# Patient Record
Sex: Male | Born: 1955
Health system: Southern US, Community
[De-identification: ages and names within clinical notes are randomized; demographics above are authoritative.]

## PROBLEM LIST (undated history)

## (undated) DIAGNOSIS — R7303 Prediabetes: Secondary | ICD-10-CM

## (undated) DIAGNOSIS — G4730 Sleep apnea, unspecified: Secondary | ICD-10-CM

## (undated) DIAGNOSIS — I10 Essential (primary) hypertension: Secondary | ICD-10-CM

## (undated) HISTORY — PX: HERNIA REPAIR: SHX51

## (undated) HISTORY — DX: Sleep apnea, unspecified: G47.30

## (undated) HISTORY — DX: Prediabetes: R73.03

---

## 2002-08-21 ENCOUNTER — Emergency Department (HOSPITAL_COMMUNITY): Admission: EM | Admit: 2002-08-21 | Discharge: 2002-08-22 | Payer: Self-pay

## 2006-01-05 ENCOUNTER — Emergency Department (HOSPITAL_COMMUNITY): Admission: EM | Admit: 2006-01-05 | Discharge: 2006-01-06 | Payer: Self-pay | Admitting: Emergency Medicine

## 2006-08-31 IMAGING — CR DG CHEST 1V PORT
1 series · 1 of 1 positions shown · non-contrast
Comparison: none

CLINICAL DATA: Chest pain. 
 PORTABLE CHEST ? 1 VIEW:

[view not recorded]
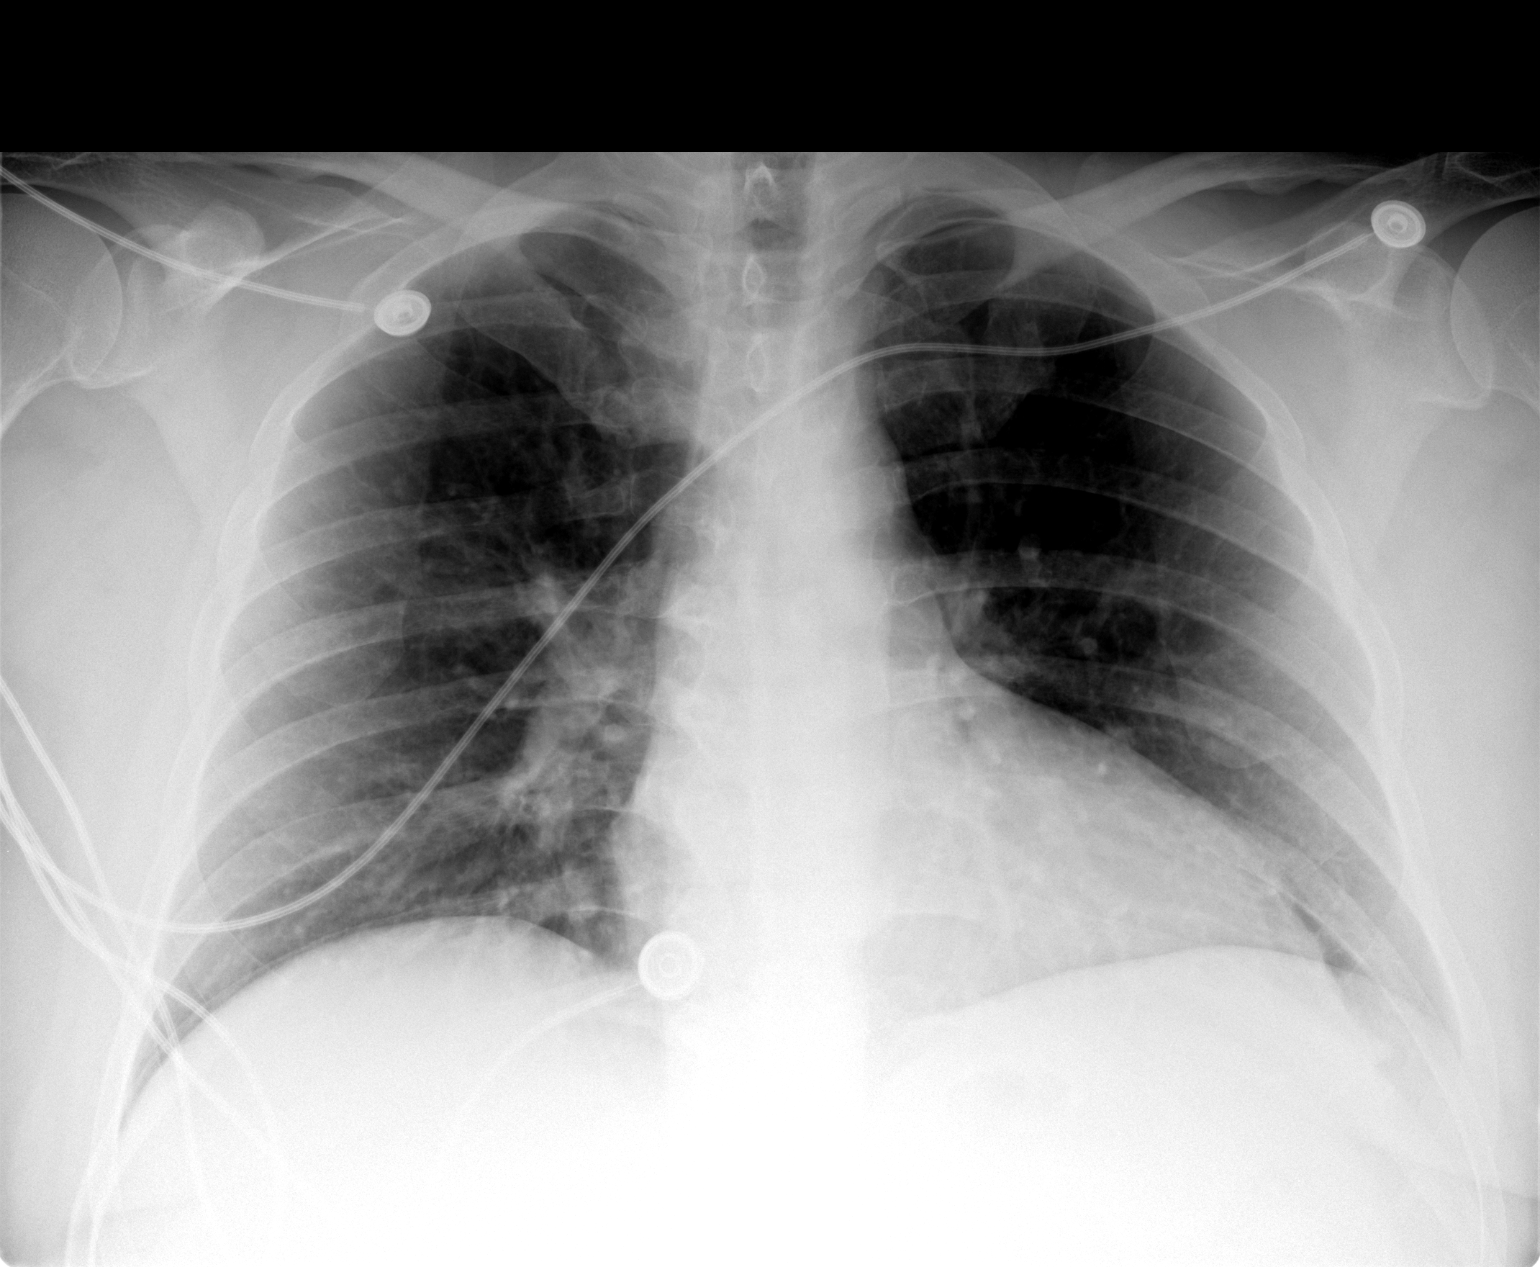

[1 of 1 positions shown; findings below may reference images not displayed]

FINDINGS: Low lung volumes are seen.  However, both lungs are clear.  Heart size and mediastinal contours are within normal limits.
IMPRESSION: Low lung volumes.  No acute findings.

## 2007-01-17 ENCOUNTER — Emergency Department (HOSPITAL_COMMUNITY): Admission: EM | Admit: 2007-01-17 | Discharge: 2007-01-18 | Payer: Self-pay | Admitting: Emergency Medicine

## 2007-01-18 IMAGING — CR DG CHEST 2V
2 series · 2 of 2 positions shown · non-contrast
Comparison: [DATE].

CLINICAL DATA: Productive cough.  Dyspnea.  
 CHEST - 2 VIEW:

[w chest pa]
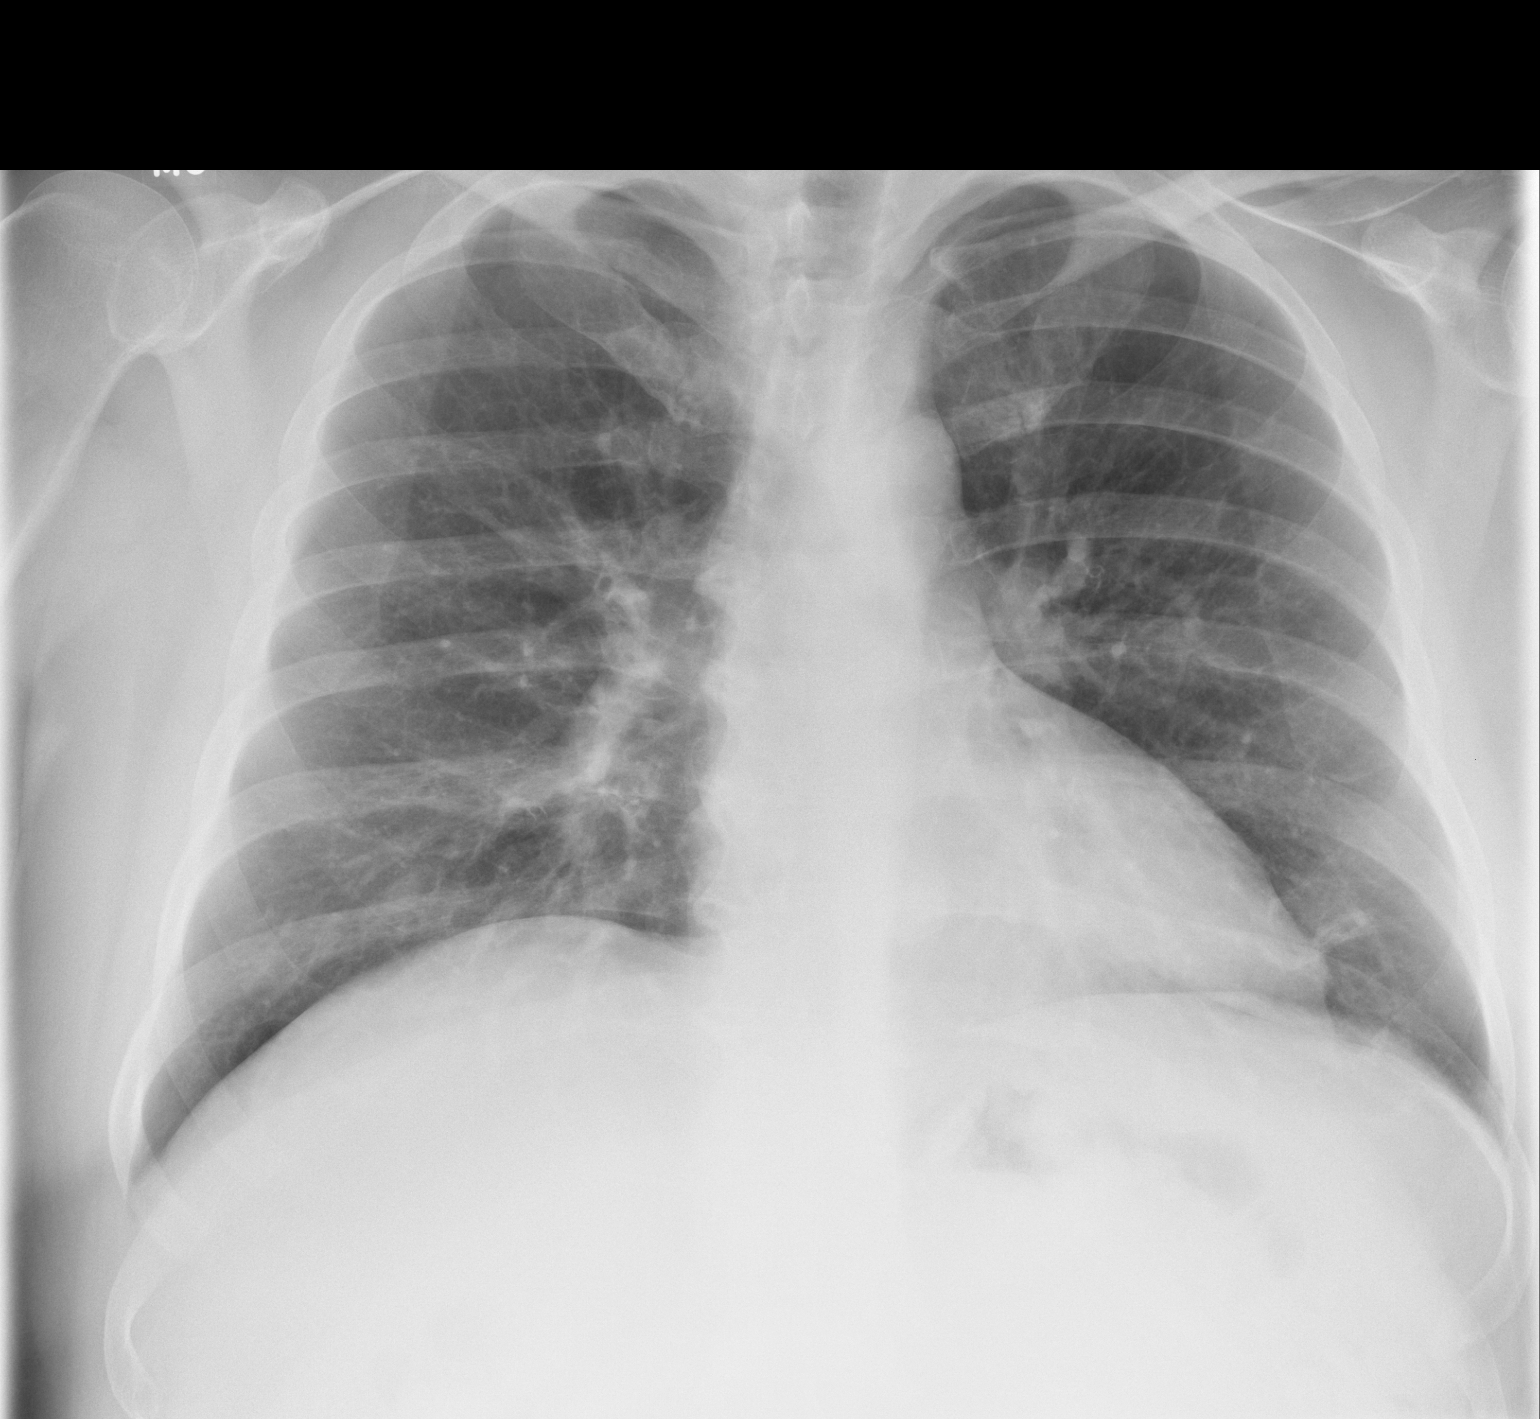

[w chest lat]
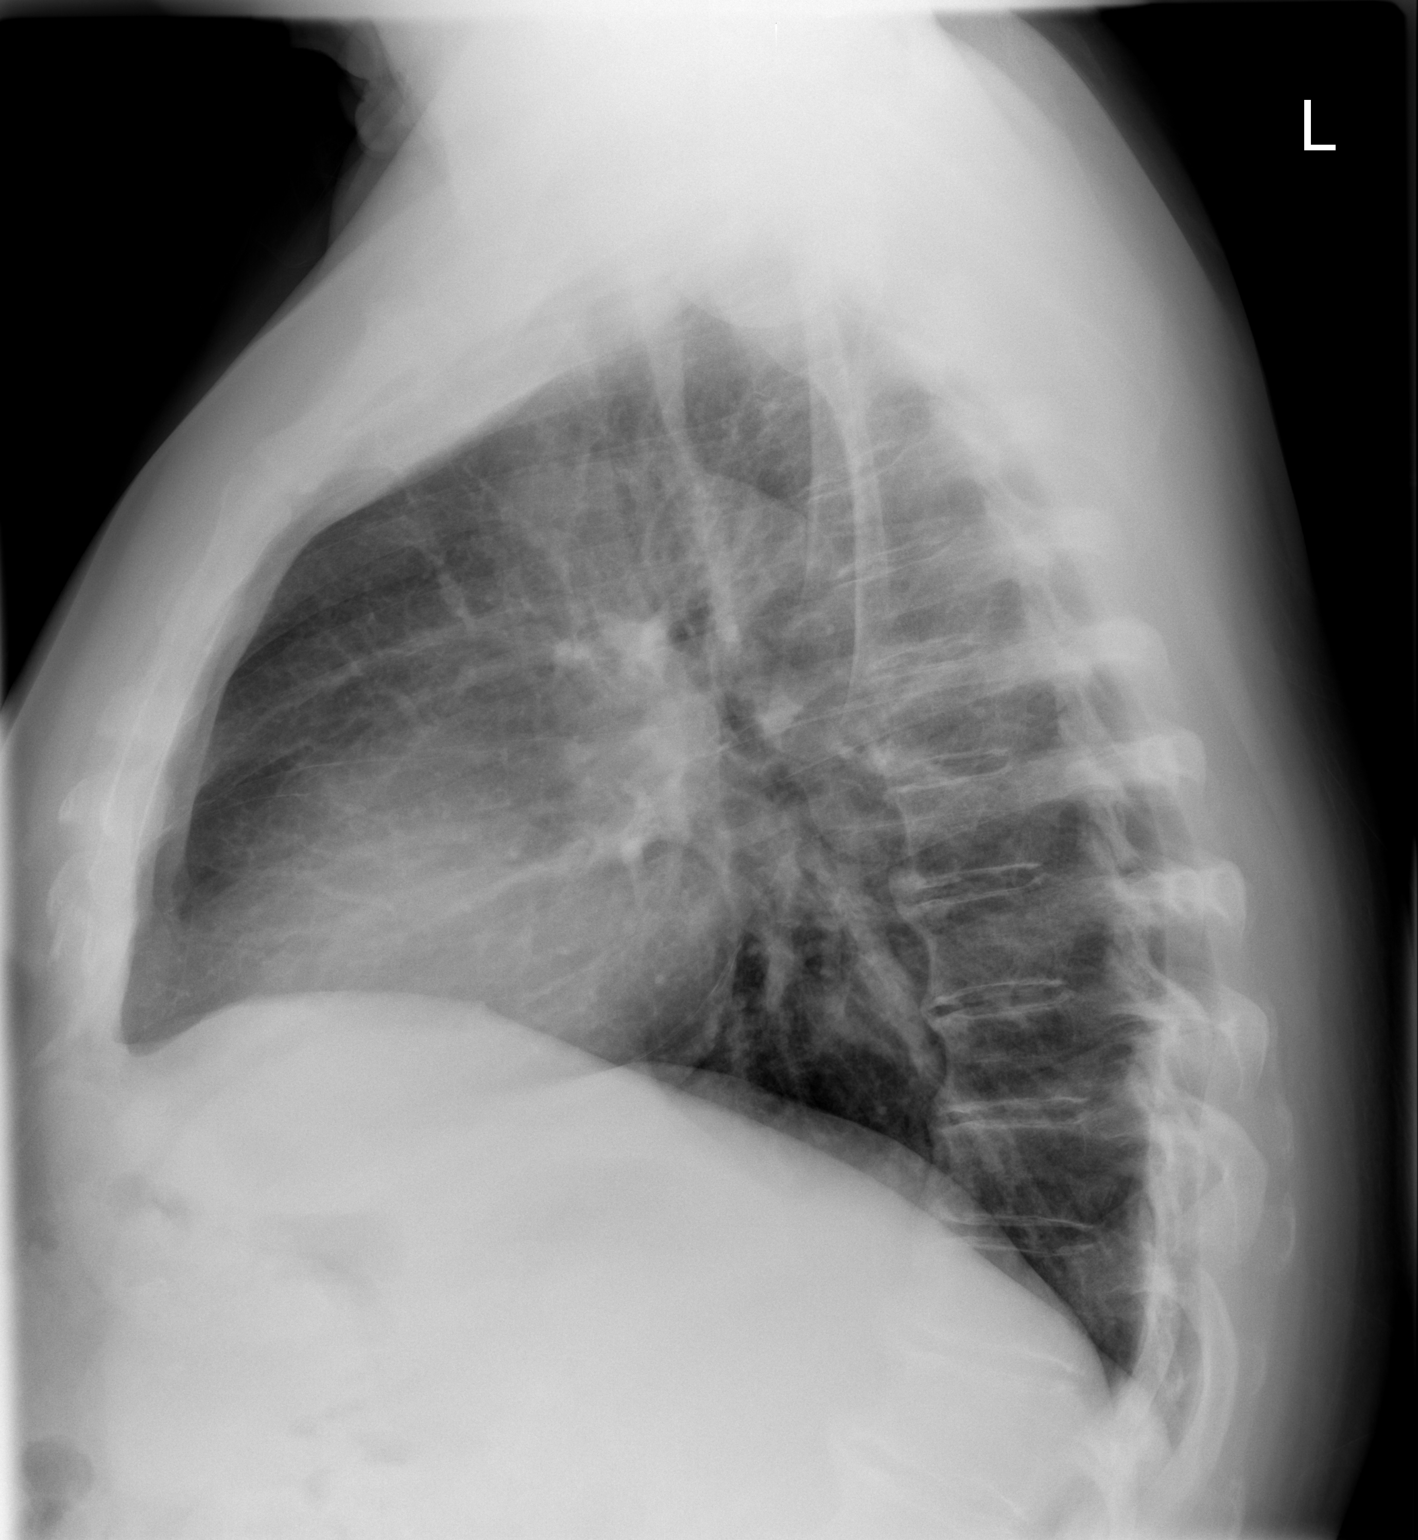

[2 of 2 positions shown; findings below may reference images not displayed]

FINDINGS: The heart size and mediastinal contours are within normal limits.  Both lungs are clear.  The visualized skeletal structures are unremarkable.
IMPRESSION: No active cardiopulmonary disease.

## 2007-03-07 ENCOUNTER — Emergency Department (HOSPITAL_COMMUNITY): Admission: EM | Admit: 2007-03-07 | Discharge: 2007-03-07 | Payer: Self-pay | Admitting: Emergency Medicine

## 2007-03-07 IMAGING — CR DG CHEST 1V PORT
1 series · 1 of 1 positions shown · non-contrast
Comparison: none

CLINICAL DATA: Chest pain.
 PORTABLE CHEST- 1 VIEW:
 This is a low-volume film.  Mild vascular congestion is noted.  Upper limits normal heart size again identified without pleural effusions or pneumothorax.

[view not recorded]
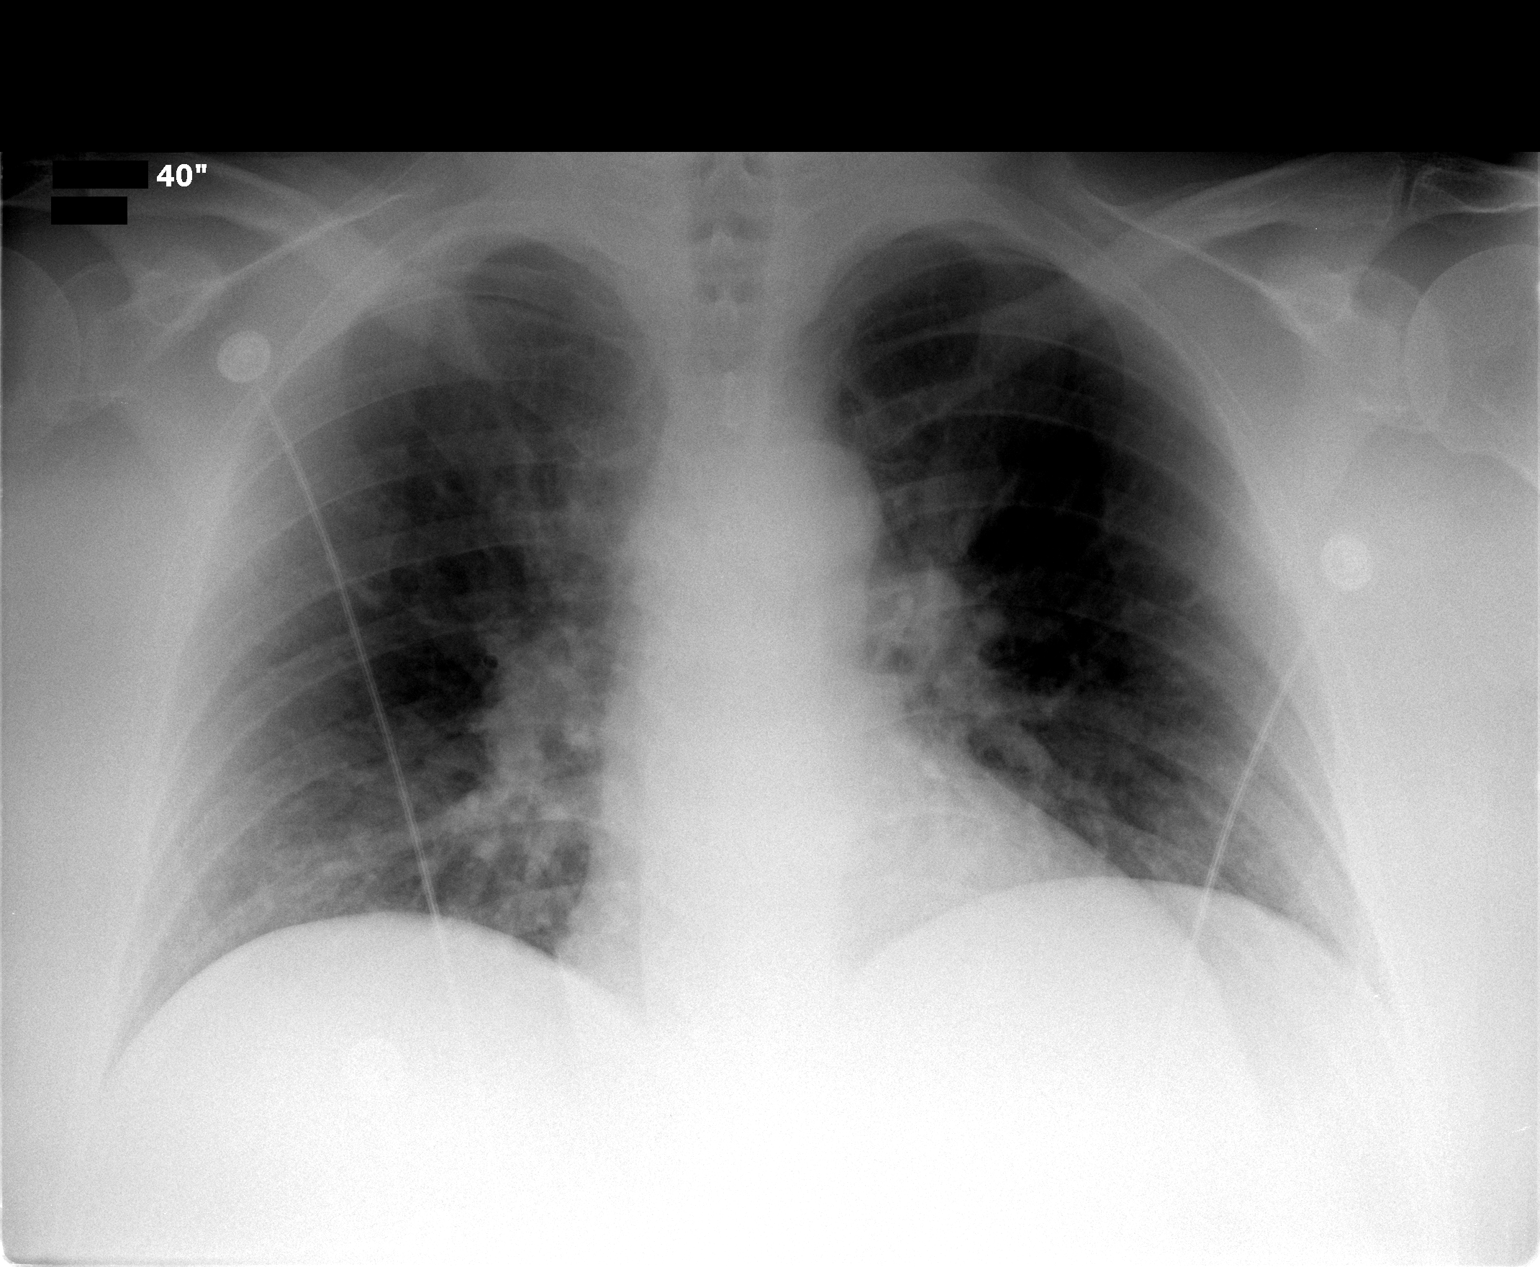

[1 of 1 positions shown; findings below may reference images not displayed]

IMPRESSION: Pulmonary vascular congestion.

## 2007-03-18 ENCOUNTER — Ambulatory Visit: Payer: Self-pay | Admitting: Cardiovascular Disease

## 2007-11-21 ENCOUNTER — Ambulatory Visit: Payer: Self-pay | Admitting: Nurse Practitioner

## 2007-11-21 DIAGNOSIS — J309 Allergic rhinitis, unspecified: Secondary | ICD-10-CM | POA: Insufficient documentation

## 2007-11-21 DIAGNOSIS — I10 Essential (primary) hypertension: Secondary | ICD-10-CM | POA: Insufficient documentation

## 2007-11-21 DIAGNOSIS — K4090 Unilateral inguinal hernia, without obstruction or gangrene, not specified as recurrent: Secondary | ICD-10-CM | POA: Insufficient documentation

## 2007-11-21 LAB — CONVERTED CEMR LAB
ALT: 24 units/L (ref 0–53)
AST: 44 units/L — ABNORMAL HIGH (ref 0–37)
Albumin: 4.4 g/dL (ref 3.5–5.2)
Alkaline Phosphatase: 58 units/L (ref 39–117)
BUN: 14 mg/dL (ref 6–23)
Basophils Absolute: 0.1 10*3/uL (ref 0.0–0.1)
Basophils Relative: 1 % (ref 0–1)
CO2: 25 meq/L (ref 19–32)
Calcium: 9 mg/dL (ref 8.4–10.5)
Chloride: 105 meq/L (ref 96–112)
Creatinine, Ser: 1.06 mg/dL (ref 0.40–1.50)
Eosinophils Absolute: 0.4 10*3/uL (ref 0.0–0.7)
Eosinophils Relative: 5 % (ref 0–5)
Glucose, Bld: 93 mg/dL (ref 70–99)
HCT: 45.5 % (ref 39.0–52.0)
Hemoglobin: 15.3 g/dL (ref 13.0–17.0)
Lymphocytes Relative: 26 % (ref 12–46)
Lymphs Abs: 1.9 10*3/uL (ref 0.7–4.0)
MCHC: 33.6 g/dL (ref 30.0–36.0)
MCV: 89.4 fL (ref 78.0–100.0)
Monocytes Absolute: 0.6 10*3/uL (ref 0.1–1.0)
Monocytes Relative: 8 % (ref 3–12)
Neutro Abs: 4.6 10*3/uL (ref 1.7–7.7)
Neutrophils Relative %: 61 % (ref 43–77)
PSA: 0.94 ng/mL (ref 0.10–4.00)
Platelets: 199 10*3/uL (ref 150–400)
Potassium: 4 meq/L (ref 3.5–5.3)
RBC: 5.09 M/uL (ref 4.22–5.81)
RDW: 12.4 % (ref 11.5–15.5)
Sodium: 142 meq/L (ref 135–145)
TSH: 2.063 microintl units/mL (ref 0.350–4.50)
Total Bilirubin: 0.5 mg/dL (ref 0.3–1.2)
Total Protein: 7.2 g/dL (ref 6.0–8.3)
WBC: 7.5 10*3/uL (ref 4.0–10.5)

## 2007-11-22 ENCOUNTER — Encounter (INDEPENDENT_AMBULATORY_CARE_PROVIDER_SITE_OTHER): Payer: Self-pay | Admitting: Nurse Practitioner

## 2007-12-05 ENCOUNTER — Ambulatory Visit: Payer: Self-pay | Admitting: Nurse Practitioner

## 2008-02-07 ENCOUNTER — Telehealth (INDEPENDENT_AMBULATORY_CARE_PROVIDER_SITE_OTHER): Payer: Self-pay | Admitting: Nurse Practitioner

## 2008-02-20 ENCOUNTER — Ambulatory Visit: Payer: Self-pay | Admitting: Nurse Practitioner

## 2008-02-23 ENCOUNTER — Ambulatory Visit: Payer: Self-pay | Admitting: *Deleted

## 2008-03-07 ENCOUNTER — Ambulatory Visit: Payer: Self-pay | Admitting: Nurse Practitioner

## 2008-03-13 IMAGING — CR DG CHEST 2V
2 series · 2 of 2 positions shown · non-contrast
Comparison: [DATE]

CLINICAL DATA: Preop radiograph

CHEST - 2 VIEW

[w chest pa *]
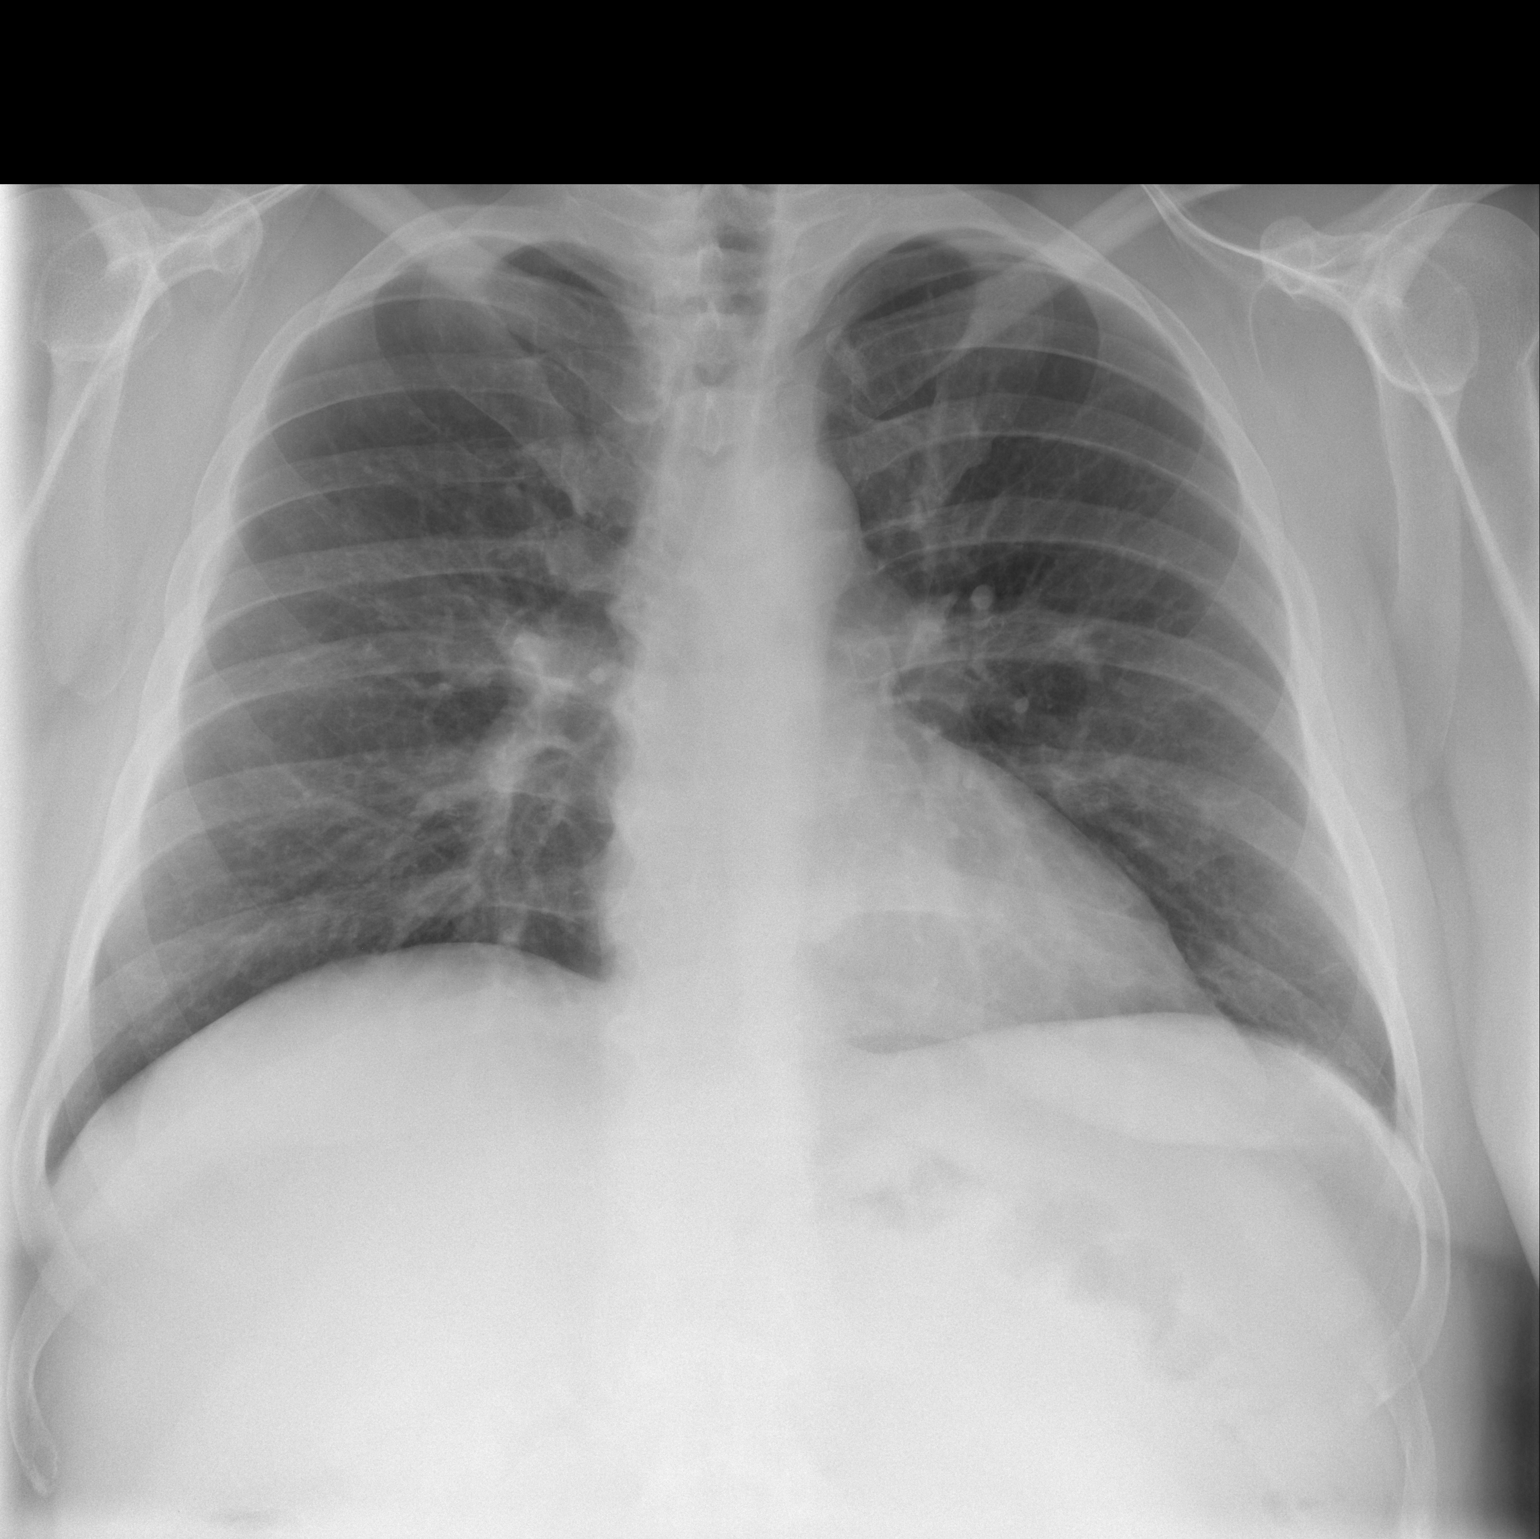

[w chest lat *]
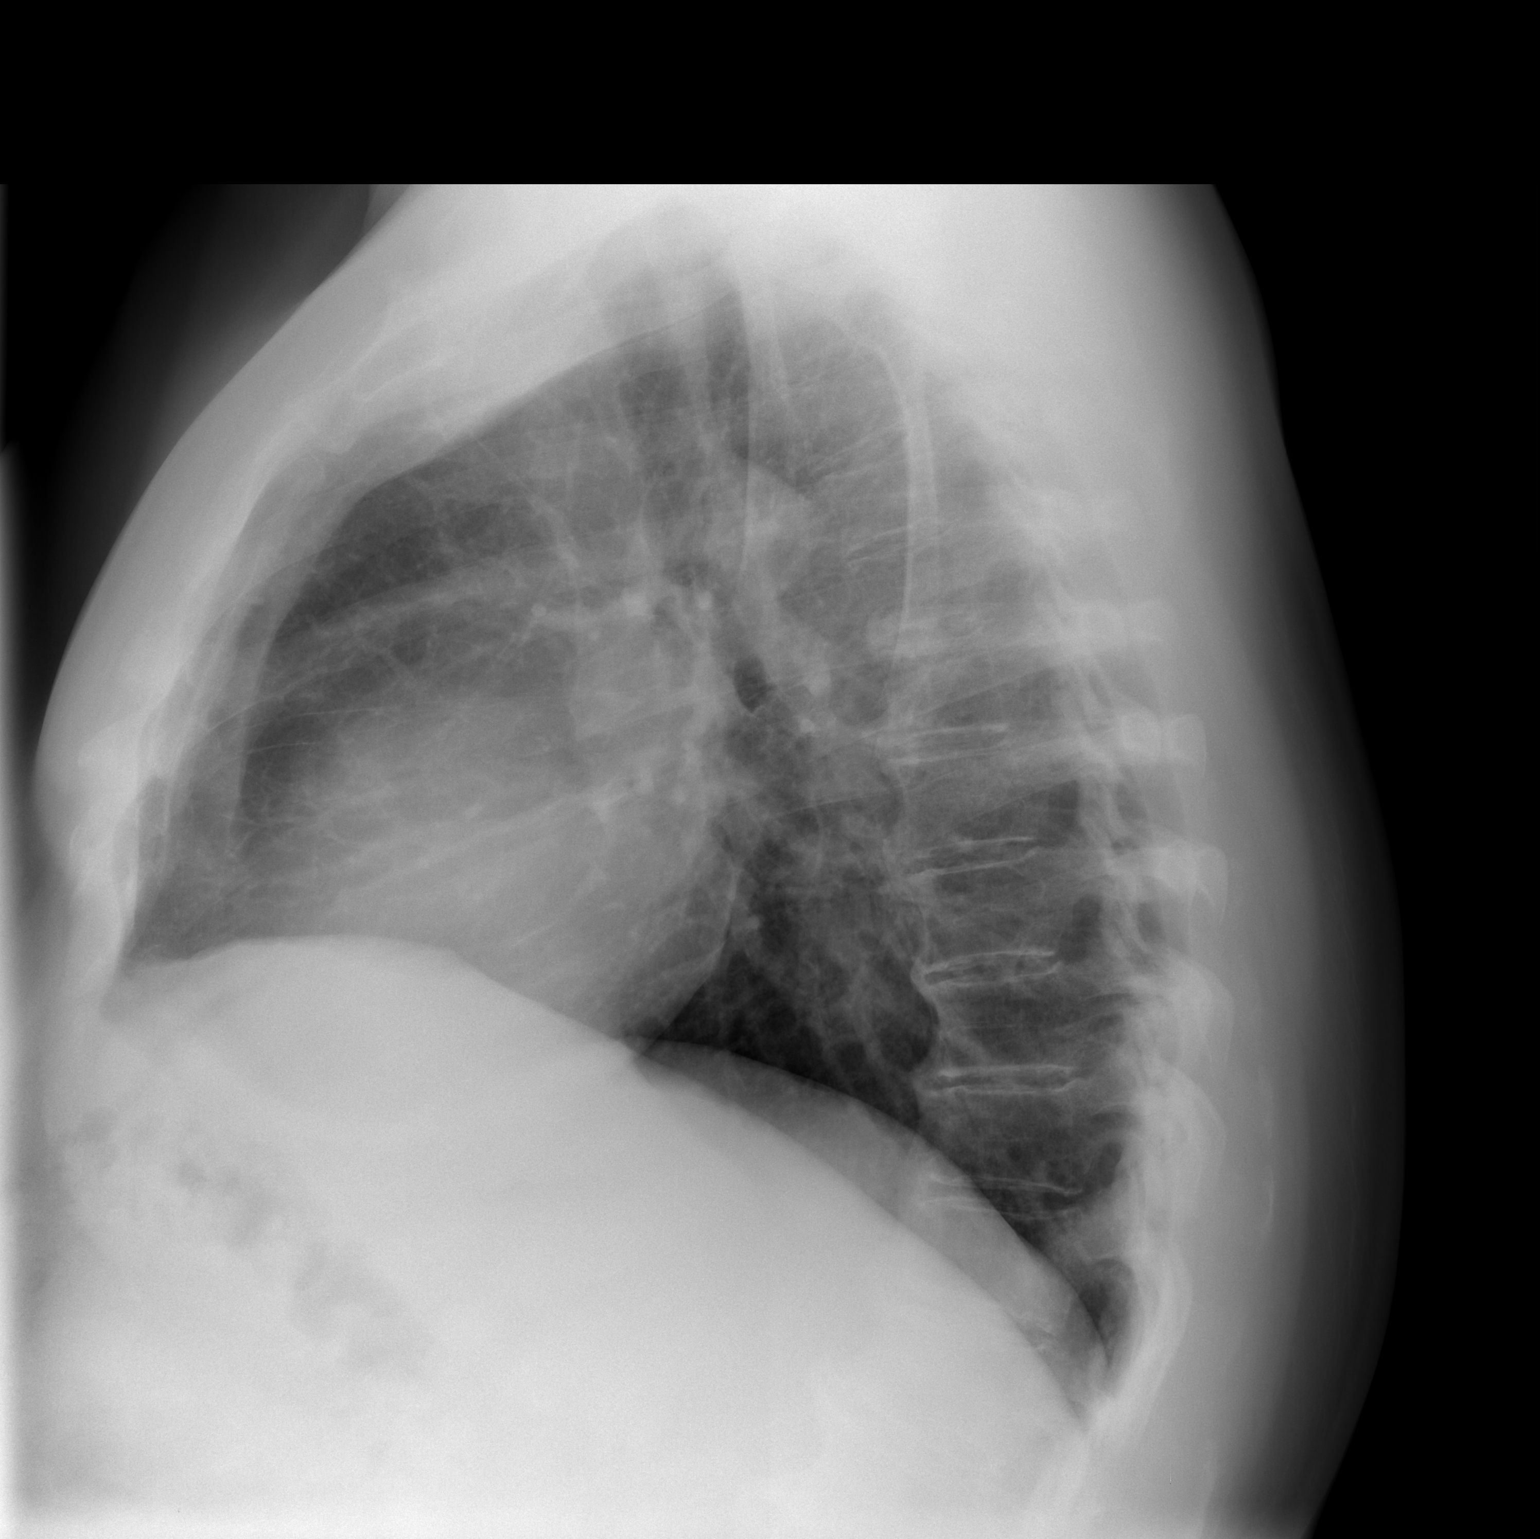

[2 of 2 positions shown; findings below may reference images not displayed]

FINDINGS: The heart size and mediastinal contours are within normal
limits.  Both lungs are clear.  The visualized skeletal structures
are unremarkable.
IMPRESSION: No active disease.

## 2008-03-15 ENCOUNTER — Encounter (INDEPENDENT_AMBULATORY_CARE_PROVIDER_SITE_OTHER): Payer: Self-pay | Admitting: General Surgery

## 2008-03-15 ENCOUNTER — Ambulatory Visit (HOSPITAL_COMMUNITY): Admission: RE | Admit: 2008-03-15 | Discharge: 2008-03-15 | Payer: Self-pay | Admitting: General Surgery

## 2008-05-21 ENCOUNTER — Ambulatory Visit: Payer: Self-pay | Admitting: Nurse Practitioner

## 2008-05-21 DIAGNOSIS — F3289 Other specified depressive episodes: Secondary | ICD-10-CM | POA: Insufficient documentation

## 2008-05-21 DIAGNOSIS — L919 Hypertrophic disorder of the skin, unspecified: Secondary | ICD-10-CM

## 2008-05-21 DIAGNOSIS — L909 Atrophic disorder of skin, unspecified: Secondary | ICD-10-CM | POA: Insufficient documentation

## 2008-05-21 DIAGNOSIS — G479 Sleep disorder, unspecified: Secondary | ICD-10-CM | POA: Insufficient documentation

## 2008-05-21 DIAGNOSIS — D239 Other benign neoplasm of skin, unspecified: Secondary | ICD-10-CM | POA: Insufficient documentation

## 2008-05-21 DIAGNOSIS — F329 Major depressive disorder, single episode, unspecified: Secondary | ICD-10-CM | POA: Insufficient documentation

## 2008-05-22 ENCOUNTER — Encounter (INDEPENDENT_AMBULATORY_CARE_PROVIDER_SITE_OTHER): Payer: Self-pay | Admitting: Nurse Practitioner

## 2008-05-29 ENCOUNTER — Ambulatory Visit: Payer: Self-pay | Admitting: Nurse Practitioner

## 2008-05-29 LAB — CONVERTED CEMR LAB
Basophils Absolute: 0.1 10*3/uL (ref 0.0–0.1)
Basophils Relative: 1 % (ref 0–1)
Eosinophils Absolute: 0.3 10*3/uL (ref 0.0–0.7)
Eosinophils Relative: 3 % (ref 0–5)
HCT: 46.9 % (ref 39.0–52.0)
Hemoglobin: 15.9 g/dL (ref 13.0–17.0)
Lymphocytes Relative: 22 % (ref 12–46)
Lymphs Abs: 1.9 10*3/uL (ref 0.7–4.0)
MCHC: 33.9 g/dL (ref 30.0–36.0)
MCV: 91.6 fL (ref 78.0–100.0)
Monocytes Absolute: 0.8 10*3/uL (ref 0.1–1.0)
Monocytes Relative: 10 % (ref 3–12)
Neutro Abs: 5.6 10*3/uL (ref 1.7–7.7)
Neutrophils Relative %: 65 % (ref 43–77)
Platelets: 216 10*3/uL (ref 150–400)
RBC: 5.12 M/uL (ref 4.22–5.81)
RDW: 13 % (ref 11.5–15.5)
Rapid Strep: NEGATIVE
WBC: 8.6 10*3/uL (ref 4.0–10.5)

## 2008-05-30 ENCOUNTER — Encounter (INDEPENDENT_AMBULATORY_CARE_PROVIDER_SITE_OTHER): Payer: Self-pay | Admitting: Nurse Practitioner

## 2008-07-03 ENCOUNTER — Ambulatory Visit: Payer: Self-pay | Admitting: Nurse Practitioner

## 2009-01-01 ENCOUNTER — Ambulatory Visit: Payer: Self-pay | Admitting: Nurse Practitioner

## 2009-03-03 ENCOUNTER — Emergency Department (HOSPITAL_COMMUNITY): Admission: EM | Admit: 2009-03-03 | Discharge: 2009-03-04 | Payer: Self-pay | Admitting: Emergency Medicine

## 2009-03-04 ENCOUNTER — Emergency Department (HOSPITAL_COMMUNITY): Admission: EM | Admit: 2009-03-04 | Discharge: 2009-03-04 | Payer: Self-pay | Admitting: Emergency Medicine

## 2009-03-04 ENCOUNTER — Telehealth (INDEPENDENT_AMBULATORY_CARE_PROVIDER_SITE_OTHER): Payer: Self-pay | Admitting: Nurse Practitioner

## 2009-03-05 ENCOUNTER — Ambulatory Visit: Payer: Self-pay | Admitting: Nurse Practitioner

## 2009-03-11 ENCOUNTER — Telehealth (INDEPENDENT_AMBULATORY_CARE_PROVIDER_SITE_OTHER): Payer: Self-pay | Admitting: Nurse Practitioner

## 2009-03-14 ENCOUNTER — Encounter (INDEPENDENT_AMBULATORY_CARE_PROVIDER_SITE_OTHER): Payer: Self-pay | Admitting: Nurse Practitioner

## 2009-04-17 ENCOUNTER — Encounter (INDEPENDENT_AMBULATORY_CARE_PROVIDER_SITE_OTHER): Payer: Self-pay | Admitting: Nurse Practitioner

## 2009-05-17 ENCOUNTER — Ambulatory Visit: Payer: Self-pay | Admitting: Nurse Practitioner

## 2009-07-21 ENCOUNTER — Emergency Department (HOSPITAL_COMMUNITY): Admission: EM | Admit: 2009-07-21 | Discharge: 2009-07-21 | Payer: Self-pay | Admitting: Emergency Medicine

## 2009-07-23 ENCOUNTER — Ambulatory Visit: Payer: Self-pay | Admitting: Nurse Practitioner

## 2009-08-21 ENCOUNTER — Ambulatory Visit: Payer: Self-pay | Admitting: Nurse Practitioner

## 2009-08-21 DIAGNOSIS — E669 Obesity, unspecified: Secondary | ICD-10-CM | POA: Insufficient documentation

## 2009-08-22 ENCOUNTER — Encounter (INDEPENDENT_AMBULATORY_CARE_PROVIDER_SITE_OTHER): Payer: Self-pay | Admitting: Nurse Practitioner

## 2009-08-22 LAB — CONVERTED CEMR LAB
BUN: 23 mg/dL (ref 6–23)
CO2: 25 meq/L (ref 19–32)
Calcium: 9.1 mg/dL (ref 8.4–10.5)
Chloride: 106 meq/L (ref 96–112)
Creatinine, Ser: 1.21 mg/dL (ref 0.40–1.50)
Glucose, Bld: 121 mg/dL — ABNORMAL HIGH (ref 70–99)
Potassium: 3.9 meq/L (ref 3.5–5.3)
Sodium: 143 meq/L (ref 135–145)

## 2009-08-27 ENCOUNTER — Telehealth (INDEPENDENT_AMBULATORY_CARE_PROVIDER_SITE_OTHER): Payer: Self-pay | Admitting: Nurse Practitioner

## 2009-11-05 ENCOUNTER — Ambulatory Visit: Payer: Self-pay | Admitting: Physician Assistant

## 2009-12-10 ENCOUNTER — Ambulatory Visit: Payer: Self-pay | Admitting: Nurse Practitioner

## 2010-02-11 ENCOUNTER — Ambulatory Visit: Payer: Self-pay | Admitting: Physician Assistant

## 2010-04-04 ENCOUNTER — Telehealth (INDEPENDENT_AMBULATORY_CARE_PROVIDER_SITE_OTHER): Payer: Self-pay | Admitting: Nurse Practitioner

## 2010-04-10 ENCOUNTER — Ambulatory Visit: Payer: Self-pay | Admitting: Nurse Practitioner

## 2010-06-01 LAB — CONVERTED CEMR LAB
ALT: 18 units/L (ref 0–53)
AST: 44 units/L — ABNORMAL HIGH (ref 0–37)
Albumin: 4.2 g/dL (ref 3.5–5.2)
Alkaline Phosphatase: 50 units/L (ref 39–117)
BUN: 15 mg/dL (ref 6–23)
CO2: 21 meq/L (ref 19–32)
Calcium: 6.5 mg/dL — ABNORMAL LOW (ref 8.4–10.5)
Chloride: 105 meq/L (ref 96–112)
Cholesterol: 160 mg/dL (ref 0–200)
Creatinine, Ser: 0.93 mg/dL (ref 0.40–1.50)
Glucose, Bld: 82 mg/dL (ref 70–99)
HCT: 45.2 % (ref 39.0–52.0)
HDL: 39 mg/dL — ABNORMAL LOW (ref >39)
Hemoglobin: 15.4 g/dL (ref 13.0–17.0)
LDL Cholesterol: 105 mg/dL — ABNORMAL HIGH (ref 0–99)
MCHC: 34.1 g/dL (ref 30.0–36.0)
MCV: 89.7 fL (ref 78.0–100.0)
Microalb, Ur: 0.56 mg/dL (ref 0.00–1.89)
PSA: 1.1 ng/mL (ref 0.10–4.00)
Platelets: 189 10*3/uL (ref 150–400)
Potassium: 4 meq/L (ref 3.5–5.3)
RBC: 5.04 M/uL (ref 4.22–5.81)
RDW: 12.9 % (ref 11.5–15.5)
Sodium: 140 meq/L (ref 135–145)
TSH: 1.815 microintl units/mL (ref 0.350–4.500)
Total Bilirubin: 0.5 mg/dL (ref 0.3–1.2)
Total CHOL/HDL Ratio: 4.1
Total Protein: 6.8 g/dL (ref 6.0–8.3)
Triglycerides: 79 mg/dL (ref <150)
VLDL: 16 mg/dL (ref 0–40)
WBC: 7 10*3/uL (ref 4.0–10.5)

## 2010-06-03 NOTE — Assessment & Plan Note (Signed)
Summary: HTN   Vital Signs:  Patient profile:   55 year old male Weight:      259.9 pounds BMI:     36.12 BSA:     2.37 Pulse rate:   71 / minute Pulse rhythm:   regular Resp:     16 per minute BP sitting:   135 / 82  (left arm) Cuff size:   regular  Vitals Entered By: Levon Hedger (August 21, 2009 9:51 AM)  Nutrition Counseling: Patient's BMI is greater than 25 and therefore counseled on weight management options. CC: follow-up visit 4 week BP...ankle still alittle sore, Hypertension Management Is Patient Diabetic? No Pain Assessment Patient in pain? no       Does patient need assistance? Functional Status Self care Ambulation Normal   CC:  follow-up visit 4 week BP...ankle still alittle sore and Hypertension Management.  History of Present Illness:  Pt into the office for blood pressure follow up.  Obesity - down 11 pounds since the last visit. Pt has tried to modify his diet and is not eating late at night. He has not been getting his sugary snacks from the local [employer]  Hypertension History:      He denies headache, chest pain, and palpitations.  He notes no problems with any antihypertensive medication side effects.  Pt is taking hctz 25mg  by mouth daily.  Further comments include: Denies any cramps in the legs.  Pt does not eat bananas on a routine basis.        Positive major cardiovascular risk factors include male age 96 years old or older and hypertension.  Negative major cardiovascular risk factors include negative family history for ischemic heart disease and non-tobacco-user status.        Further assessment for target organ damage reveals no history of ASHD, cardiac end-organ damage (CHF/LVH), stroke/TIA, peripheral vascular disease, renal insufficiency, or hypertensive retinopathy.     Habits & Providers  Alcohol-Tobacco-Diet     Tobacco Status: never  Exercise-Depression-Behavior     Does Patient Exercise: no     Have you felt down or  hopeless? no     Have you felt little pleasure in things? no     Depression Counseling: not indicated; screening negative for depression     Drug Use: no     Seat Belt Use: 100     Sun Exposure: occasionally  Allergies (verified): 1)  ! Lisinopril  Review of Systems CV:  Denies chest pain or discomfort. Resp:  Denies cough. GI:  Denies abdominal pain, nausea, and vomiting.  Physical Exam  General:  alert.   Head:  normocephalic.   Lungs:  normal breath sounds.   Heart:  normal rate and regular rhythm.   Msk:  up to the exam table Neurologic:  alert & oriented X3.     Impression & Recommendations:  Problem # 1:  HYPERTENSION, BENIGN ESSENTIAL (ICD-401.1) BP is stable DASH diet reviewed Continue current meds will check bmet today His updated medication list for this problem includes:    Hydrochlorothiazide 25 Mg Tabs (Hydrochlorothiazide) .Marland Kitchen... Take one (1) by mouth daily  Orders: T-Basic Metabolic Panel [phone])  Problem # 2:  OBESITY (ICD-278.00) pt has lost 11 pounds since his last visit encouraged pt to continue weight loss efforts  Complete Medication List: 1)  Ibuprofen 800 Mg Tabs (Ibuprofen) .... One tablet by mouth two times a day as needed for pain 2)  Hydrochlorothiazide 25 Mg Tabs (Hydrochlorothiazide) .... Take one (1) by  mouth daily  Hypertension Assessment/Plan:      The patient's hypertensive risk group is category B: At least one risk factor (excluding diabetes) with no target organ damage.  His calculated 10 year risk of coronary heart disease is 11 %.  Today's blood pressure is 135/82.  His blood pressure goal is < 140/90.  Patient Instructions: 1)  Your blood pressure is doing GREAT. 2)  Weight loss is a good addition.  This helps your blood pressure and overall health 3)  You will be notified of any abnormal labs 4)  Follow up in 6 months for blood pressure Prescriptions: HYDROCHLOROTHIAZIDE 25 MG TABS (HYDROCHLOROTHIAZIDE) Take one (1)  by mouth daily  #90 x 1   Entered and Authorized by:   Lehman Prom FNP   Signed by:   Lehman Prom FNP on 08/21/2009   Method used:   Print then Give to Patient   RxID:   [id number]

## 2010-06-03 NOTE — Assessment & Plan Note (Signed)
Summary: Ear problem   Vital Signs:  Patient profile:   55 year old male Weight:      267.4 pounds BMI:     37.17 Temp:     97.1 degrees F oral Pulse rate:   80 / minute Pulse rhythm:   regular Resp:     20 per minute BP sitting:   140 / 80  (left arm) Cuff size:   regular  Vitals Entered By: Levon Hedger (April 10, 2010 4:10 PM)  Nutrition Counseling: Patient's BMI is greater than 25 and therefore counseled on weight management options. CC: last wednesday noticed some blood in his ear after cleaning with a Q-tip Is Patient Diabetic? No Pain Assessment Patient in pain? no       Does patient need assistance? Functional Status Self care Ambulation Normal   Primary Care Provider:  Lehman Prom FNP  CC:  last wednesday noticed some blood in his ear after cleaning with a Q-tip.  History of Present Illness:  Pt into the office with c/o right ear pain and blood Reports that he was cleaning his ear with a q-tip and saw blood he was concerned so he came into the office to get it checked No pain in ear No drainage in ear  Weight  - increased since the last visit - up 12 pounds since the past visit Pt admits that his weight increases over winter months due to changes in mood.  Habits & Providers  Alcohol-Tobacco-Diet     Alcohol drinks/day: 0     Tobacco Status: never  Exercise-Depression-Behavior     Does Patient Exercise: no     Depression Counseling: not indicated; screening negative for depression     Drug Use: no     Seat Belt Use: 100     Sun Exposure: occasionally  Allergies (verified): 1)  ! Lisinopril  Review of Systems General:  Denies fever. ENT:  Complains of earache. CV:  Denies chest pain or discomfort. Resp:  Denies cough. GI:  Denies abdominal pain, nausea, and vomiting.  Physical Exam  General:  alert.   Head:  normocephalic.   Ears:  right ear canal scab due to trauma left ear with clear fluid  Msk:  normal ROM.   Neurologic:   alert & oriented X3.   Skin:  color normal.   Psych:  Oriented X3.     Impression & Recommendations:  Problem # 1:  HYPERTENSION, BENIGN ESSENTIAL (ICD-401.1) BP is elevated. advised pt to restart on meds His updated medication list for this problem includes:    Hydrochlorothiazide 25 Mg Tabs (Hydrochlorothiazide) .Marland Kitchen... Take one (1) by mouth daily  Problem # 2:  FOREIGN BODY IN EAR (ICD-931) advised pt not to put any foreign objects in the ear  Complete Medication List: 1)  Hydrochlorothiazide 25 Mg Tabs (Hydrochlorothiazide) .... Take one (1) by mouth daily 2)  Lotrisone 1-0.05 % Crea (Clotrimazole-betamethasone) .... Apply application topically to affected area two times a day until healed  Patient Instructions: 1)  You have scarred the canal in your right ear. 2)  This will heal over time 3)  Do not put anything in your ear. 4)  Blood pressure - elevated some today 5)  Restart your blood pressure medications 6)  Follow up as needed Prescriptions: HYDROCHLOROTHIAZIDE 25 MG TABS (HYDROCHLOROTHIAZIDE) Take one (1) by mouth daily  #90 x 1   Entered and Authorized by:   Lehman Prom FNP   Signed by:   Lehman Prom FNP  on 04/10/2010   Method used:   Print then Give to Patient   RxID:   [id number] HYDROCHLOROTHIAZIDE 25 MG TABS (HYDROCHLOROTHIAZIDE) Take one (1) by mouth daily  #90 x 1   Entered and Authorized by:   Lehman Prom FNP   Signed by:   Lehman Prom FNP on 04/10/2010   Method used:   Print then Give to Patient   RxID:   [id number]    Orders Added: 1)  Est. Patient Level III [20254]    Prevention & Chronic Care Immunizations   Influenza vaccine: Refused  (04/10/2010)   Influenza vaccine deferral: Refused  (03/05/2009)    Tetanus booster: 01/01/2009: Tdap    Pneumococcal vaccine: Not documented  Colorectal Screening   Hemoccult: Not documented    Colonoscopy: Not documented  Other Screening   PSA: 1.10   (01/01/2009)   Smoking status: never  (04/10/2010)  Lipids   Total Cholesterol: 160  (01/01/2009)   LDL: 105  (01/01/2009)   LDL Direct: Not documented   HDL: 39  (01/01/2009)   Triglycerides: 79  (01/01/2009)  Hypertension   Last Blood Pressure: 140 / 80  (04/10/2010)   Serum creatinine: 1.21  (08/21/2009)   Serum potassium 3.9  (08/21/2009)  Self-Management Support :    Hypertension self-management support: Not documented

## 2010-06-03 NOTE — Progress Notes (Signed)
Summary: LOST BP RX  Phone Note Call from Patient Call back at [facility] Phone [phone]   Summary of Call: MARTIN PT MR Andrades CALLED TO LET YOU KNOW THAT HE LOST HIS BP RX FROM LAST WEEK VISIT. HE USES [facility] ON [address] THE NUMBER IS [phone] Initial call taken by: Leodis Rains,  August 27, 2009 10:31 AM  Follow-up for Phone Call        CALLED MED INTO PHARMACY OF PT'S CHOICE Follow-up by: [place] Shannon,  August 27, 2009 2:31 PM

## 2010-06-03 NOTE — Assessment & Plan Note (Signed)
Summary: Acute - Skin tags   Vital Signs:  Patient Profile:   55 Years Old Male Height:     71.25 inches Weight:      270.5 pounds BMI:     37.60 BSA:     2.41 Temp:     97.5 degrees F oral Pulse rate:   72 / minute Pulse rhythm:   regular Resp:     16 per minute BP sitting:   102 / 80  (left arm) Cuff size:   large  Pt. in pain?   no  Vitals Entered By: Levon Hedger (May 21, 2008 10:18 AM)              Is Patient Diabetic? No  Does patient need assistance? Ambulation Normal     Chief Complaint:  moles on him wants to referred to a dermatologist.  History of Present Illness:  pt into the office with some questions about some skin problems Moles have been present for many years. He reports that sometimes the areas get irritated from his clothes. no change in color with the ones he can see  Also with laryngitis.  Productive over the past week.   started about 3 days ago with hoarse voice he has been gargling with warm salt water Pt has taken mucinex and robitussin. reports that he his improving with OTC regimen -fever -n/v  Hernia operation 03/15/08 -- referred by this office  Depression History:      Positive alarm features for depression include insomnia, fatigue (loss of energy), and feelings of worthlessness (guilt).        Psychosocial stress factors include major life changes.  The patient denies that he feels like life is not worth living, denies that he wishes that he were dead, and denies that he has thought about ending his life.  Due to his current symptoms, it often takes extra effort to do the things he needs to do.         Depression Treatment History:  Prior Medication Used:   Start Date: Assessment of Effect:   Comments:  Celexa (citalopram)     05/21/2008     --       --  Hypertension History:      He denies headache, chest pain, and palpitations.        Positive major cardiovascular risk factors include male age 43 years old or  older and hypertension.  Negative major cardiovascular risk factors include negative family history for ischemic heart disease and non-tobacco-user status.        Updated Prior Medication List: LISINOPRIL 10 MG TABS (LISINOPRIL) 1 by mouth once daily  Current Allergies (reviewed today): No known allergies     Risk Factors: Tobacco use:  never Drug use:  no Alcohol use:  yes Exercise:  no Seatbelt use:  100 % Sun Exposure:  occasionally  Family History Risk Factors:    Family History of MI in females < 52 years old:  no    Family History of MI in males < 49 years old:  no   Review of Systems  General      Complains of sleep disorder.  ENT      Complains of hoarseness.  Derm      Complains of changes in nail beds.  Psych      Complains of depression.   Physical Exam  General:     alert.   Eyes:     glasses Lungs:  normal breath sounds.   Heart:     normal rate and regular rhythm.   Abdomen:     normal bowel sounds.   Msk:     multiple nevi to chest left ear with skin tags - flesh tone color Neurologic:     alert & oriented X3.      Impression & Recommendations:  Problem # 1:  SKIN TAG (ICD-701.9) will refer to derm Orders: Dermatology Referral (Derma)   Problem # 2:  NEVI, MULTIPLE (ICD-216.9) will refer to derm Orders: Dermatology Referral (Derma)   Problem # 3:  LARYNGITIS, ACUTE (ICD-464.00) gargle with warm salt water  Problem # 4:  HYPERTENSION, BENIGN ESSENTIAL (ICD-401.1) will honor request for 90 day supply of meds bp stable His updated medication list for this problem includes:    Lisinopril 10 Mg Tabs (Lisinopril) .Marland Kitchen... 1 by mouth once daily   Problem # 5:  DEPRESSION (ICD-311)  His updated medication list for this problem includes:    Celexa 20 Mg Tabs (Citalopram hydrobromide) .Marland Kitchen... 1 tablet by mouth daily for mood   Problem # 6:  SLEEP DISORDER (ICD-780.50) likely due to prob #5 as pt only recently started  having problems with sleep  Complete Medication List: 1)  Lisinopril 10 Mg Tabs (Lisinopril) .Marland Kitchen.. 1 by mouth once daily 2)  Celexa 20 Mg Tabs (Citalopram hydrobromide) .Marland Kitchen.. 1 tablet by mouth daily for mood  Hypertension Assessment/Plan:      The patient's hypertensive risk group is category B: At least one risk factor (excluding diabetes) with no target organ damage.  Today's blood pressure is 102/80.  His blood pressure goal is < 140/90.   Patient Instructions: 1)  You will be referred to dermatology at [facility] street. 2)  will start medications for mood.  take 1 tablet by mouth daily to see if mood/sleep improves. If not return to office. 3)  Follow up in office in 6 months for blood pressure or sooner if necessary   Prescriptions: CELEXA 20 MG TABS (CITALOPRAM HYDROBROMIDE) 1 tablet by mouth daily for mood  #30 x 3   Entered and Authorized by:   Lehman Prom FNP   Signed by:   Lehman Prom FNP on 05/21/2008   Method used:   Print then Give to Patient   RxID:   [id number] LISINOPRIL 10 MG TABS (LISINOPRIL) 1 by mouth once daily  #90 x 1   Entered and Authorized by:   Lehman Prom FNP   Signed by:   Lehman Prom FNP on 05/21/2008   Method used:   Printed then faxed to ...       [facility] (retail)       [address] [place], [place]  [address]       Ph: [phone]       Fax: [phone]   RxID:   [id number]

## 2010-06-03 NOTE — Assessment & Plan Note (Signed)
Summary: Tinea Cruris   Vital Signs:  Patient profile:   55 year old male Height:      71.25 inches Weight:      255 pounds BMI:     35.44 Temp:     97.5 degrees F oral Pulse rate:   76 / minute Pulse rhythm:   regular Resp:     18 per minute BP sitting:   110 / 80  (left arm) Cuff size:   regular  Vitals Entered By: [place] Shannon (February 11, 2010 11:46 AM) CC: pt is here for rash on buttocks... Is Patient Diabetic? No Pain Assessment Patient in pain? no       Does patient need assistance? Functional Status Self care Ambulation Normal   Primary Care Provider:  Lehman Prom FNP  CC:  pt is here for rash on buttocks....  History of Present Illness: Here for f/u on tinea cruris.  Has had 2 rounds of lotrisone cream.  Rash seems to get better when he uses it, but then gets worse when he stops.  He denies excessive sweating.  Uses good hygiene.  No fevers or chills.  No h/o liver problems.  No alcohol use.  Current Medications (verified): 1)  Ibuprofen 800 Mg Tabs (Ibuprofen) .... One Tablet By Mouth Two Times A Day As Needed For Pain 2)  Hydrochlorothiazide 25 Mg Tabs (Hydrochlorothiazide) .... Take One (1) By Mouth Daily 3)  Lotrisone 1-0.05 % Crea (Clotrimazole-Betamethasone) .... Apply Application Topically To Affected Area Two Times A Day Until Healed  Allergies (verified): 1)  ! Lisinopril  Physical Exam  General:  alert, well-developed, and well-nourished.   Head:  normocephalic and atraumatic.   Msk:  normal ROM.   Neurologic:  alert & oriented X3 and cranial nerves II-XII intact.   Skin:  annular plaque in groin with darkened skin pigment scattered nodular rash on buttocks with excoriation  Psych:  normally interactive.     Impression & Recommendations:  Problem # 1:  TINEA CRURIS (ICD-110.3) seems resistant to topical therapy will tx with terbinafine by mouth x 14 days ? erythrasma in groin area . . . will also have him use benzoyl peroxide x 7  days f/u if no resolution  Complete Medication List: 1)  Ibuprofen 800 Mg Tabs (Ibuprofen) .... One tablet by mouth two times a day as needed for pain 2)  Hydrochlorothiazide 25 Mg Tabs (Hydrochlorothiazide) .... Take one (1) by mouth daily 3)  Lotrisone 1-0.05 % Crea (Clotrimazole-betamethasone) .... Apply application topically to affected area two times a day until healed 4)  Terbinafine Hcl 250 Mg Tabs (Terbinafine hcl) .... Take 1 tablet by mouth once a day for 2 weeks 5)  Benzoyl Peroxide 2.5 % Gel (Benzoyl peroxide) .... Apply to darkened skin in groin once daily for 7 days; make sure you apply after bathing and the skin is dry  Patient Instructions: 1)  Take terbinafine once daily until all gone. 2)  Use benzoyl peroxide gel once daily for 7 days.  Apply it to the dark skin in your groin after bathing.  Make sure your skin is dry. 3)  If the rash does not resolve in 3-4 weeks, return for follow up. Prescriptions: BENZOYL PEROXIDE 2.5 % GEL (BENZOYL PEROXIDE) apply to darkened skin in groin once daily for 7 days; make sure you apply after bathing and the skin is dry  #1 bottle x 0   Entered and Authorized by:   Tereso Newcomer PA-C   Signed by:  Tereso Newcomer PA-C on 02/11/2010   Method used:   Print then Give to Patient   RxID:   [id number] TERBINAFINE HCL 250 MG TABS (TERBINAFINE HCL) Take 1 tablet by mouth once a day for 2 weeks  #14 x 0   Entered and Authorized by:   Tereso Newcomer PA-C   Signed by:   Tereso Newcomer PA-C on 02/11/2010   Method used:   Print then Give to Patient   RxID:   [id number]

## 2010-06-03 NOTE — Assessment & Plan Note (Signed)
Summary: HTN   Vital Signs:  Patient profile:   55 year old male Weight:      271.6 pounds Temp:     97.5 degrees F oral Pulse rate:   70 / minute Pulse rhythm:   regular Resp:     16 per minute BP sitting:   146 / 75  (left arm) Cuff size:   regular  Vitals Entered By: Levon Hedger (July 23, 2009 9:30 AM) CC: pt states went to a party on sunday night he had some swelling in the back of his throat pt went to the ER and was told he thinks it was a reaction to his Lisinopril and was given a new Rx for HCTZ 25 mg, Depression Is Patient Diabetic? No Pain Assessment Patient in pain? no       Does patient need assistance? Functional Status Self care Ambulation Normal   CC:  pt states went to a party on sunday night he had some swelling in the back of his throat pt went to the ER and was told he thinks it was a reaction to his Lisinopril and was given a new Rx for HCTZ 25 mg and Depression.  History of Present Illness:  Pt into the office with allergic reaction to lisinopril. ER visit on 07/21/2009 due to swelling in his throat.   He was able to swallow and breathing was NOT labored. He actually was camping at the time symptoms started.  He packed up and drove 45 minutes home,dropped off equipment then went to the ER. Pt reports similar episodes x 2 previously (but pt had not informed this provider ) He was given benadryl 25mg IVP, solumedrol 125mg IVP and pepcid 20mg IVP. Swelling eventually resolved (before pt left the ER). No further episodes since hospital d/c.  lisinopril was discontinued and he was started on HCTZ 25mg by mouth daily.  Rx picked up on 07/21/2009 and pt presents with meds today in office.    Depression History:      The patient denies a depressed mood most of the day and a diminished interest in his usual daily activities.        The patient denies that he feels like life is not worth living, denies that he wishes that he were dead, and denies that he  has thought about ending his life.        Comments:  "I am feeling better since the weather is getting warm".  Depression Treatment History:  Prior Medication Used:   Start Date: Assessment of Effect:   Comments:  Celexa (citalopram)     01 /18/2010   no improvement       pt never started the medication   Habits & Providers  Alcohol-Tobacco-Diet     Alcohol drinks/day: 0     Tobacco Status: never  Exercise-Depression-Behavior     Does Patient Exercise: no     Depression Counseling: not indicated; screening negative for depression     Drug Use: no     Seat Belt Use: 100     Sun Exposure: occasionally  Allergies (verified): 1)  ! Lisinopril  Review of Systems ENT:  Denies sore throat; swelling in throat on 07/21/2009 at ER.  no further swelling following that visit. CV:  Denies chest pain or discomfort. Resp:  Denies cough. GI:  Denies nausea and vomiting.  Physical Exam  General:  alert.   Head:  normocephalic.   Eyes:  glasses Mouth:  uvula midline no swelling Lungs:  normal breath sounds.   Heart:  normal rate and regular rhythm.   Msk:  up to the exam table Neurologic:  alert & oriented X3.   Skin:  color normal.   Psych:  Oriented X3.     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 One tablet by mouth daily for blood pressure His updated medication list for this problem includes:    Hydrochlorothiazide 25 Mg Tabs (Hydrochlorothiazide) .Marland Kitchen... Take one (1) by mouth daily  Complete Medication List: 1)  Ibuprofen 800 Mg Tabs (Ibuprofen) .... One tablet by mouth two times a day as  needed for pain 2)  Hydrochlorothiazide 25 Mg Tabs (Hydrochlorothiazide) .... Take one (1) by mouth daily  Patient Instructions: 1)  Follow up in 4 weeks with n.martin,fnp for htn. 2)  Continue taking HCTZ 25mg  daily.

## 2010-06-03 NOTE — Assessment & Plan Note (Signed)
Summary: Acute - Salivary Mucocele   Vital Signs:  Patient profile:   55 year old male Weight:      270.1 pounds BMI:     37.54 BSA:     2.40 Temp:     97.9 degrees F oral Pulse rate:   68 / minute Pulse rhythm:   regular Resp:     16 per minute BP sitting:   145 / 83  (left arm) Cuff size:   regular  Vitals Entered By: Levon Hedger (May 17, 2009 9:45 AM) CC: hard spot inside lip x 2 months that comes and goes, Hypertension Management Is Patient Diabetic? No Pain Assessment Patient in pain? no       Does patient need assistance? Functional Status Self care Ambulation Normal   CC:  hard spot inside lip x 2 months that comes and goes and Hypertension Management.  History of Present Illness:  Pt into the office with c/o "hard spot" in his left jaw. Started about 2 months ago. Area improves with his manipulation and then returns a few weeks later. no pain  no erythema No hinderance with food/eating Unsure if he has been eating any new or spicy foods -fever  No history of chewing tobacco or snuff   Hypertension History:      He denies headache, chest pain, and palpitations.  He notes no problems with any antihypertensive medication side effects.  Pt is taking his BP meds daily but did NOT take today. "I forgot".        Positive major cardiovascular risk factors include male age 25 years old or older and hypertension.  Negative major cardiovascular risk factors include negative family history for ischemic heart disease and non-tobacco-user status.        Further assessment for target organ damage reveals no history of ASHD, cardiac end-organ damage (CHF/LVH), stroke/TIA, peripheral vascular disease, renal insufficiency, or hypertensive retinopathy.     Habits & Providers  Alcohol-Tobacco-Diet     Alcohol drinks/day: 0     Tobacco Status: never  Exercise-Depression-Behavior     Does Patient Exercise: no     Have you felt down or hopeless? no     Have you  felt little pleasure in things? no     Depression Counseling: not indicated; screening negative for depression     Drug Use: no     Seat Belt Use: 100     Sun Exposure: occasionally  Allergies (verified): No Known Drug Allergies  Review of Systems General:  Denies fever. CV:  Denies chest pain or discomfort. Resp:  Denies cough. GI:  Denies constipation.  Physical Exam  General:  alert.   Head:  normocephalic.   Mouth:  mouth - right inner jaw with circumscribed fluid filled  lesion resembling mucocele non-tender Lungs:  normal breath sounds.   Heart:  normal rate and regular rhythm.   Msk:  up to the exam table Neurologic:  alert & oriented X3.     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 advised pt to take meds daily especially when coming to this office His updated medication list for this problem includes:    Lisinopril 10 Mg Tabs (Lisinopril) ..... One tablet by mouth daily for blood pressure  Complete Medication List: 1)  Lisinopril 10 Mg Tabs (Lisinopril) .... One  tablet by mouth daily for blood pressure 2)  Ibuprofen 800 Mg Tabs (Ibuprofen) .... One tablet by mouth two times a day as needed for pain  Hypertension Assessment/Plan:      The patient's hypertensive risk group is category B: At least one risk factor (excluding diabetes) with no target organ damage.  His calculated 10 year risk of coronary heart disease is 14 %.  Today's blood pressure is 145/83.  His blood pressure goal is < 140/90.  Patient Instructions: 1)  Area in mouth is mucocele of your salivary gland.  it should improve spontaneously and should not be problematic. 2)  Stone analysis - calcium.  3)  Continue to take blood pressure medications 4)  Follow up as needed

## 2010-06-03 NOTE — Assessment & Plan Note (Signed)
Summary: 3629 PT//RASH////KT   Vital Signs:  Patient profile:   55 year old male Height:      71.25 inches Weight:      261 pounds BMI:     36.28 Temp:     97.9 degrees F oral Pulse rate:   67 / minute Pulse rhythm:   regular Resp:     18 per minute BP sitting:   134 / 89  (left arm) Cuff size:   regular  Vitals Entered By: [place] Shannon (November 05, 2009 9:59 AM) CC: pt is here for rash x a week now.... pt says the rash is itching and he has been using benadryl Is Patient Diabetic? No Pain Assessment Patient in pain? no       Does patient need assistance? Functional Status Self care Ambulation Normal   Primary Care Provider:  Lehman Prom FNP  CC:  pt is here for rash x a week now.... pt says the rash is itching and he has been using benadryl.  History of Present Illness: Here for rash in groin and buttocks.  Has noticed about a week.  It is pruritic.  He has had ringworm in the past and this is similar.  Has tried benadryl due to the itching.    Problems Prior to Update: 1)  Obesity  (ICD-278.00) 2)  H/F Angioedema  (ICD-995.1) 3)  Mucocele of Salivary Gland  (ICD-527.6) 4)  Nephrolithiasis  (ICD-592.0) 5)  Bursitis, Right Shoulder  (ICD-726.10) 6)  Ankle Pain, Left  (ICD-719.47) 7)  Sleep Disorder  (ICD-780.50) 8)  Depression  (ICD-311) 9)  Laryngitis, Acute  (ICD-464.00) 10)  Nevi, Multiple  (ICD-216.9) 11)  Skin Tag  (ICD-701.9) 12)  Inguinal Hernia, Right  (ICD-550.90) 13)  Allergic Rhinitis  (ICD-477.9) 14)  Hypertension, Benign Essential  (ICD-401.1) 15)  Family History Diabetes 1st Degree Relative  (ICD-V18.0)  Current Medications (verified): 1)  Ibuprofen 800 Mg Tabs (Ibuprofen) .... One Tablet By Mouth Two Times A Day As Needed For Pain 2)  Hydrochlorothiazide 25 Mg Tabs (Hydrochlorothiazide) .... Take One (1) By Mouth Daily  Allergies (verified): 1)  ! Lisinopril  Physical Exam  General:  alert, well-developed, and well-nourished.   Head:   normocephalic and atraumatic.   Neurologic:  alert & oriented X3 and cranial nerves II-XII intact.   Skin:  annular plaque in groin R>L with some excoriation scattered nodular rash on buttocks with excoriation  Psych:  normally interactive.     Impression & Recommendations:  Problem # 1:  TINEA CRURIS (ICD-110.3) rx for Lotrisone two times a day x 2 weeks f/u if no better  Complete Medication List: 1)  Ibuprofen 800 Mg Tabs (Ibuprofen) .... One tablet by mouth two times a day as needed for pain 2)  Hydrochlorothiazide 25 Mg Tabs (Hydrochlorothiazide) .... Take one (1) by mouth daily 3)  Lotrisone 1-0.05 % Crea (Clotrimazole-betamethasone) .... Apply to rash two times a day until gone; do not use longer than 2 weeks  Patient Instructions: 1)  Apply cream to rash two times a day until clear.  Do not use more than 2 weeks.  If rash is getting worse or not better in 2 weeks, return for follow up. Prescriptions: LOTRISONE 1-0.05 % CREA (CLOTRIMAZOLE-BETAMETHASONE) apply to rash two times a day until gone; do not use longer than 2 weeks  #45 grams x 1   Entered and Authorized by:   Tereso Newcomer PA-C   Signed by:   Tereso Newcomer PA-C on 11/05/2009  Method used:   Print then Give to Patient   RxID:   [id number]

## 2010-06-03 NOTE — Assessment & Plan Note (Signed)
Summary: F/u Rash   Vital Signs:  Patient profile:   55 year old male Weight:      256.6 pounds BMI:     35.67 Temp:     97.7 degrees F oral Pulse rate:   78 / minute Pulse rhythm:   regular Resp:     20 per minute BP sitting:   130 / 70  (left arm) Cuff size:   regular  Vitals Entered By: Levon Hedger (December 10, 2009 10:42 AM)  Nutrition Counseling: Patient's BMI is greater than 25 and therefore counseled on weight management options. CC: pt was in about 3 weeks ago for jock itch and it is still going on not as bad but still there, Hypertension Management Is Patient Diabetic? No Pain Assessment Patient in pain? no       Does patient need assistance? Functional Status Self care Ambulation Normal   Primary Care Provider:  Lehman Prom FNP  CC:  pt was in about 3 weeks ago for jock itch and it is still going on not as bad but still there and Hypertension Management.  History of Present Illness:  Pt into the office for f/u on jock itch as dx a couple of weeks ago.   Symptoms have improved but have not totally resolved.  Much improvement though but pt was advised to f/u if symptoms not totally resolved in 2 weeks. Pt does note that there has been marked improvement He is using the cream as ordered  Hypertension History:      He denies headache, chest pain, and palpitations.  He notes no problems with any antihypertensive medication side effects.        Positive major cardiovascular risk factors include male age 61 years old or older and hypertension.  Negative major cardiovascular risk factors include negative family history for ischemic heart disease and non-tobacco-user status.        Further assessment for target organ damage reveals no history of ASHD, cardiac end-organ damage (CHF/LVH), stroke/TIA, peripheral vascular disease, renal insufficiency, or hypertensive retinopathy.     Medications Prior to Update: 1)  Ibuprofen 800 Mg Tabs (Ibuprofen) .... One  Tablet By Mouth Two Times A Day As Needed For Pain 2)  Hydrochlorothiazide 25 Mg Tabs (Hydrochlorothiazide) .... Take One (1) By Mouth Daily 3)  Lotrisone 1-0.05 % Crea (Clotrimazole-Betamethasone) .... Apply To Rash Two Times A Day Until Gone; Do Not Use Longer Than 2 Weeks  Allergies (verified): 1)  ! Lisinopril  Review of Systems CV:  Denies chest pain or discomfort. Resp:  Denies cough. GI:  Denies abdominal pain, nausea, and vomiting. Derm:  Complains of rash.  Physical Exam  General:  alert.   Head:  normocephalic.   Msk:  normal ROM.   Neurologic:  alert & oriented X3.   Psych:  Oriented X3.     Impression & Recommendations:  Problem # 1:  TINEA CRURIS (ICD-110.3) will refill cream advised pt to continue to use cream to the affected area advise pt to keep area clean  Problem # 2:  HYPERTENSION, BENIGN ESSENTIAL (ICD-401.1) BP stable His updated medication list for this problem includes:    Hydrochlorothiazide 25 Mg Tabs (Hydrochlorothiazide) .Marland Kitchen... Take one (1) by mouth daily  Complete Medication List: 1)  Ibuprofen 800 Mg Tabs (Ibuprofen) .... One tablet by mouth two times a day as needed for pain 2)  Hydrochlorothiazide 25 Mg Tabs (Hydrochlorothiazide) .... Take one (1) by mouth daily 3)  Lotrisone 1-0.05 % Crea (Clotrimazole-betamethasone) .Marland KitchenMarland KitchenMarland Kitchen  Apply application topically to affected area two times a day until healed  Hypertension Assessment/Plan:      The patient's hypertensive risk group is category B: At least one risk factor (excluding diabetes) with no target organ damage.  His calculated 10 year risk of coronary heart disease is 11 %.  Today's blood pressure is 130/70.  His blood pressure goal is < 140/90.  Patient Instructions: 1)  Rash - May take about 1 more week for total resolution and continue to use the cream until healed. 2)  Blood pressure - doing well.  Continue current medications. 3)  Follow up as needed Prescriptions: LOTRISONE 1-0.05 % CREA  (CLOTRIMAZOLE-BETAMETHASONE) Apply application topically to affected area two times a day until healed  #30gm x 1   Entered and Authorized by:   Lehman Prom FNP   Signed by:   Lehman Prom FNP on 12/10/2009   Method used:   Faxed to ...       [facility] (retail)       [address] [place], [place]  [address]       Ph: [phone]       Fax: [phone]   RxID:   [id number]

## 2010-06-03 NOTE — Progress Notes (Signed)
Summary: Bleeding noted in ear  Phone Note Call from Patient   Summary of Call: WED MORNING/ CLEANED OUT EAR WITH Q TIP/THERE WAS BROWN BLOOD LIKE OLD BLOOD ON Q TIP AND FRESH BLOOD /DOESN'T [facility] OR HURT JUST CONCERNED ABOUT THE BLOOD/HAS APPT ON THE 8TH OF DEC ,BUT IF POSSIBLE WOULD LIKE FOR YOU TO TAKE A LOOK AT IT,BEFORE THE WEEKEND AND THEN HE CAN CANCEL  THE 8TH APPT. PHONE [phone] Initial call taken by: Arta Bruce,  April 04, 2010 8:21 AM  Follow-up for Phone Call        Denies any drainage at this time.  Yesterday there was a little discomfort, but no real pain.  Denies change in hearing. Has tried to clean ears since, no further bleeding.   Advised not to use cotton swabs or anything else in his inner ear to clean; that he can use a washcloth to wipe any wax that may accumulate, that the wax will usually work it's way out.  Verbalized understanding.  States he doesn't need to be seen, will wait until the 8th. Follow-up by: Dutch Quint RN,  April 04, 2010 4:13 PM

## 2010-06-03 NOTE — Letter (Signed)
Summary: *HSN Results Follow up  [facility]  [address] [place], [place] [address]   Phone: [phone]  Fax: [phone]      08/22/2009   Joshua Forbes [address] [place], [place]  [address]   Dear  Joshua Forbes,                            ____S.Drinkard,FNP   ____D. Gore,FNP       ____B. McPherson,MD   ____V. Rankins,MD    ____E. Mulberry,MD    __X__N. Daphine Deutscher, FNP  ____D. Reche Dixon, MD    ____K. Philipp Deputy, MD    ____Other     This letter is to inform you that your recent test(s):  _______Pap Smear    ___X____Lab Test     _______X-ray    ____X___ is within acceptable limits  _______ requires a medication change  _______ requires a follow-up lab visit  _______ requires a follow-up visit with your provider   Comments: labs done during recent office visit were normal.       _________________________________________________________ If you have any questions, please contact our office [phone].                    Sincerely,    Joshua Prom FNP [facility]

## 2010-06-10 ENCOUNTER — Encounter (INDEPENDENT_AMBULATORY_CARE_PROVIDER_SITE_OTHER): Payer: Self-pay | Admitting: Nurse Practitioner

## 2010-06-10 ENCOUNTER — Encounter: Payer: Self-pay | Admitting: Nurse Practitioner

## 2010-06-19 NOTE — Assessment & Plan Note (Signed)
Summary: Referral for Eye Exam   Vital Signs:  Patient profile:   55 year old male Height:      71.25 inches Weight:      263.5 pounds BMI:     36.63 Temp:     96.8 degrees F oral Pulse rate:   72 / minute Pulse rhythm:   regular Resp:     18 per minute BP sitting:   130 / 90  (left arm) Cuff size:   regular  Vitals Entered By: Arm[place] Shannon (June 10, 2010 9:51 AM)  Nutrition Counseling: Patient's BMI is greater than 25 and therefore counseled on weight management options. CC: referral for new glasses, Hypertension Management Is Patient Diabetic? No Pain Assessment Patient in pain? no       Does patient need assistance? Functional Status Self care Ambulation Normal   Primary Care Provider:  Lehman Prom FNP  CC:  referral for new glasses and Hypertension Management.  History of Present Illness:  Pt into the office with questions about an eye exam Pt has not had an eye exam in over 10 years. Pt primarily has a problem with reading and needs to wear glasses. He would like to get a referral to get his eyes checked as he is aware that he is aging and that changes are coming.  No other acute problems today  Obesity - down 4 pounds since the last visit.  "I am trying to lose weight"  Hypertension History:      He denies headache, chest pain, and palpitations.  He notes no problems with any antihypertensive medication side effects.        Positive major cardiovascular risk factors include male age 43 years old or older and hypertension.  Negative major cardiovascular risk factors include negative family history for ischemic heart disease and non-tobacco-user status.        Further assessment for target organ damage reveals no history of ASHD, cardiac end-organ damage (CHF/LVH), stroke/TIA, peripheral vascular disease, renal insufficiency, or hypertensive retinopathy.     Habits & Providers  Alcohol-Tobacco-Diet     Alcohol drinks/day: 0     Tobacco Status:  never  Exercise-Depression-Behavior     Does Patient Exercise: no     Have you felt down or hopeless? no     Have you felt little pleasure in things? no     Depression Counseling: not indicated; screening negative for depression     Drug Use: no     Seat Belt Use: 100     Sun Exposure: occasionally  Current Medications (verified): 1)  Hydrochlorothiazide 25 Mg Tabs (Hydrochlorothiazide) .... Take One (1) By Mouth Daily 2)  Lotrisone 1-0.05 % Crea (Clotrimazole-Betamethasone) .... Apply Application Topically To Affected Area Two Times A Day Until Healed  Allergies (verified): 1)  ! Lisinopril  Review of Systems General:  Denies fever. CV:  Denies chest pain or discomfort. Resp:  Denies cough. GI:  Denies abdominal pain, nausea, and vomiting. MS:  Denies joint pain.  Physical Exam  General:  alert.   Head:  normocephalic.   Lungs:  normal breath sounds.   Heart:  normal rate and regular rhythm.   Abdomen:  normal bowel sounds.   Msk:  normal ROM.   Neurologic:  alert & oriented X3.     Impression & Recommendations:  Problem # 1:  VISUAL ACUITY, DECREASED (ICD-369.9) advised pt he needs an eye exam - last done 10 years ago Orders: Ophthalmology Referral (Ophthalmology)  Problem #  2:  HYPERTENSION, BENIGN ESSENTIAL (ICD-401.1) Pt is still taking meds.  Advised to decrease weight His updated medication list for this problem includes:    Hydrochlorothiazide 25 Mg Tabs (Hydrochlorothiazide) .Marland Kitchen... Take one (1) by mouth daily  Problem # 3:  OBESITY (ICD-278.00) weight down 4 pounds since last visit advise pt to continue  Complete Medication List: 1)  Hydrochlorothiazide 25 Mg Tabs (Hydrochlorothiazide) .... Take one (1) by mouth daily 2)  Lotrisone 1-0.05 % Crea (Clotrimazole-betamethasone) .... Apply application topically to affected area two times a day until healed  Hypertension Assessment/Plan:      The patient's hypertensive risk group is category B: At least one  risk factor (excluding diabetes) with no target organ damage.  His calculated 10 year risk of coronary heart disease is 14 %.  Today's blood pressure is 130/90.  His blood pressure goal is < 140/90.  Patient Instructions: 1)  You will need to get a basic eye exam.  You can get the prescription and get your glasses from anywhere you choose. 2)  Blood pressure - 130/80 today. 3)  Keep taking your blood pressure medications. 4)  Weight - You are down 4 pounds since your last visit. 5)  Follow up as needed   Orders Added: 1)  Est. Patient Level III [[address]] 2)  Ophthalmology Referral [Ophthalmology]

## 2010-07-23 ENCOUNTER — Encounter: Payer: Self-pay | Admitting: Nurse Practitioner

## 2010-07-23 ENCOUNTER — Encounter (INDEPENDENT_AMBULATORY_CARE_PROVIDER_SITE_OTHER): Payer: Self-pay | Admitting: Nurse Practitioner

## 2010-07-31 NOTE — Assessment & Plan Note (Signed)
Summary: Acute - Otitis Media   Vital Signs:  Patient profile:   55 year old male Weight:      262.2 pounds Temp:     97.6 degrees F oral Pulse rate:   56 / minute Pulse rhythm:   regular Resp:     16 per minute BP sitting:   128 / 84  (left arm) Cuff size:   regular  Vitals Entered By: Levon Hedger (July 23, 2010 11:41 AM) CC: referral for colonoscopy..ears fill full, Hypertension Management Is Patient Diabetic? No Pain Assessment Patient in pain? no       Does patient need assistance? Functional Status Self care Ambulation Normal   Primary Care Provider:  Lehman Prom FNP  CC:  referral for colonoscopy..ears fill full and Hypertension Management.  History of Present Illness:  Pt into the office for f/u on ears. Admits that he went flew to [place] and on his way back his ears were popping No pain but extreme popping He kept having to move his jaw from side to side to help alleviate the symptoms +feeling of fullness in both ears R>L   Hypertension History:      He denies headache, chest pain, and palpitations.  He notes no problems with any antihypertensive medication side effects.  BP is doing will.  Marland Kitchen        Positive major cardiovascular risk factors include male age 4 years old or older and hypertension.  Negative major cardiovascular risk factors include negative family history for ischemic heart disease and non-tobacco-user status.        Further assessment for target organ damage reveals no history of ASHD, cardiac end-organ damage (CHF/LVH), stroke/TIA, peripheral vascular disease, renal insufficiency, or hypertensive retinopathy.     Allergies (verified): 1)  ! Lisinopril  Review of Systems General:  Denies fever. ENT:  Complains of earache; denies sore throat; ? of feeling liquid in the ear. CV:  Denies fatigue. Resp:  Denies cough. GI:  Denies abdominal pain, nausea, and vomiting.  Physical Exam  General:  alert.   Head:  normocephalic.     Ears:  bil TM with erythema - right ear drum is retracted left ear drum is buldging Msk:  normal ROM.   Neurologic:  alert & oriented X3.   Skin:  color normal.   Psych:  Oriented X3.     Impression & Recommendations:  Problem # 1:  OTITIS MEDIA (ICD-382.9) dx reviewed with pt His updated medication list for this problem includes:    Amoxicillin 500 Mg Caps (Amoxicillin) ..... One capsule by mouth three times a day for infection  Problem # 2:  ALLERGIC RHINITIS (ICD-477.9)  His updated medication list for this problem includes:    Loratadine 10 Mg Tabs (Loratadine) ..... One tablet by mouth daily for allergies  Complete Medication List: 1)  Hydrochlorothiazide 25 Mg Tabs (Hydrochlorothiazide) .... Take one (1) by mouth daily 2)  Lotrisone 1-0.05 % Crea (Clotrimazole-betamethasone) .... Apply application topically to affected area two times a day until healed 3)  Loratadine 10 Mg Tabs (Loratadine) .... One tablet by mouth daily for allergies 4)  Amoxicillin 500 Mg Caps (Amoxicillin) .... One capsule by mouth three times a day for infection  Hypertension Assessment/Plan:      The patient's hypertensive risk group is category B: At least one risk factor (excluding diabetes) with no target organ damage.  His calculated 10 year risk of coronary heart disease is 9 %.  Today's blood pressure is 128/84.  His blood pressure goal is < 140/90.  Patient Instructions: 1)  You have an infection in her right ear. 2)  This is likely due to fluid in your ears and the flight did not help matters. 3)  Take the amoxil 500mg  by mouth three times a day for infection.  Take with food. 4)  You will be given a prescription for allergy medication. Remember is symptoms return then take the allergy medications 5)  Follow up was needed. Prescriptions: AMOXICILLIN 500 MG CAPS (AMOXICILLIN) One capsule by mouth three times a day for infection  #30 x 0   Entered and Authorized by:   Lehman Prom FNP    Signed by:   Lehman Prom FNP on 07/23/2010   Method used:   Print then Give to Patient   RxID:   [id number] LORATADINE 10 MG TABS (LORATADINE) One tablet by mouth daily for allergies  #90 x 0   Entered and Authorized by:   Lehman Prom FNP   Signed by:   Lehman Prom FNP on 07/23/2010   Method used:   Print then Give to Patient   RxID:   [id number] HYDROCHLOROTHIAZIDE 25 MG TABS (HYDROCHLOROTHIAZIDE) Take one (1) by mouth daily  #90 x 1   Entered and Authorized by:   Lehman Prom FNP   Signed by:   Lehman Prom FNP on 07/23/2010   Method used:   Print then Give to Patient   RxID:   [id number]    Orders Added: 1)  Est. Patient Level III [[address]]

## 2010-07-31 NOTE — Letter (Signed)
Summary: Handout Printed  Printed Handout:  - Otitis Media (Middle Ear Infection)-Brief

## 2010-08-07 LAB — URINALYSIS, ROUTINE W REFLEX MICROSCOPIC
Bilirubin Urine: NEGATIVE
Glucose, UA: NEGATIVE mg/dL
Leukocytes, UA: NEGATIVE
Nitrite: NEGATIVE
Protein, ur: NEGATIVE mg/dL
Specific Gravity, Urine: 1.035 — ABNORMAL HIGH (ref 1.005–1.030)
Urobilinogen, UA: 1 mg/dL (ref 0.0–1.0)
pH: 5.5 (ref 5.0–8.0)

## 2010-08-07 LAB — URINE CULTURE
Colony Count: NO GROWTH
Culture: NO GROWTH

## 2010-08-07 LAB — URINE MICROSCOPIC-ADD ON

## 2010-09-16 NOTE — Op Note (Signed)
NAME:  Joshua Forbes, Joshua Forbes NO.:  [id number]   MEDICAL RECORD NO.:  [id number]          PATIENT TYPE:  AMB   LOCATION:  DAY                          FACILITY:  [facility]   PHYSICIAN:  Adolph Pollack, M.D.DATE OF BIRTH:  [birth year]   DATE OF PROCEDURE:  03/15/2008  DATE OF DISCHARGE:                               OPERATIVE REPORT   PREOPERATIVE DIAGNOSIS:  Right inguinal hernia.   POSTOPERATIVE DIAGNOSIS:  Pantaloon right inguinal.   PROCEDURE:  Right inguinal hernia repair with plug (polypropylene) and  mesh (polypropylene).   SURGEON:  Adolph Pollack, M.D.   ANESTHESIA:  General plus Marcaine local.   INDICATIONS:  This is a 55 year old gentleman now whom I have seen in  the past with a right inguinal hernia which is enlarged and now goes  down to the scrotum and he presents for repair.  We discussed the  procedure, risks and aftercare preoperatively.   TECHNIQUE:  He was seen in the holding area and the right groin marked  my initials.  He was then brought to the operating room, placed supine  on the operating table and general anesthetic was administered.  The  hair on the right groin, lower abdominal wall and scrotal area was  clipped and the area sterilely prepped and draped.  Local anesthetic was  infiltrated in the right groin area and right groin incision was made  through the skin and subcutaneous tissue until I exposed the external  oblique aponeurosis which was somewhat attenuated medially.  I then  injected local anesthetic deep to the external oblique aponeurosis.  Incision was made in the external oblique aponeurosis through the  external ring medially up toward the anterior superior iliac spine  laterally.  Using blunt dissection, I exposed the shelving edge of the  inguinal ligament inferiorly as well as internal oblique aponeurosis and  muscle superiorly.  There is a very large hernia present going down in  the scrotum.  Using careful blunt  dissection, I was able to reduce the  hernia.  The hernia sac was thinned at some point.  It appeared to be  omentum down in the scrotum.  Once I reduced this I then identified the  cord structures and dissected this free from the sac.  I then noted that  he had a pantaloon type hernia with indirect and direct defects.  I then  reduced the sac through the defect and then used a large mesh plug to  keep this sac from extruding out through the defect and anchored it to  the attenuated transversalis fascia in all directions with interrupted 2-  0 Prolene sutures.  I then encircled the cord and retracted it  anteriorly.  A piece of 3 x 6 inches polypropylene mesh was brought into  the field and was anchored 2 cm medial to pubic tubercle with 2-0  Prolene suture.  The inferior aspect of the mesh was then anchored to  the shelving edge of the inguinal hernia with a running 2-0 Prolene  suture to a level 2 cm lateral to the internal ring.  A  slit was cut in  the mesh and two tails wrapped around the cord.  The superior aspect of  the mesh was then anchored to the internal oblique aponeurosis with  interrupted 2-0 Vicryl sutures.  Two tails of the cord were crossed  creating a new internal ring and these were anchored to the shelving  edge of the inguinal ligament with a single 2-0 Prolene suture.  The tip  of the hemostat was able to be placed through the new aperture.   Following this, hemostasis was adequate.  I tucked the lateral aspect of  mesh deep to the external oblique aponeurosis then closed the external  oblique aponeurosis over the mesh and cord with a running 3-0 Vicryl  suture.  Scarpa's fascia was closed with running 2-0 Vicryl suture.  The  skin was closed with a 4-0 Monocryl subcuticular  stitch followed by Steri-Strips and sterile dressing.  He tolerated the  procedure well without any apparent complications and the right testicle  was in its normal position in the scrotum.  He  was taken to the recovery  room satisfactory condition.      Adolph Pollack, M.D.  Electronically Signed     TJR/MEDQ  D:  03/15/2008  T:  03/16/2008  Job:  [id number]

## 2010-09-16 NOTE — Procedures (Signed)
[facility]                              EXERCISE TREADMILL   NAME:Joshua Forbes, Joshua Forbes                       MRN:          [id number]  DATE:03/18/2007                            DOB:          1955-06-23    A 55 year old patient with chest pain.   The patient exercised for 8 and 1/2 minutes under the standard Bruce  protocol.  He entered the equivalent of a minute and a half in the stage  IV.  Exercise was stopped due to dyspnea and fatigue.  Max heart rate  was 162.  Peak blood pressure was 221/78.   Resting EKG was normal with stressors, no ischemia.   IMPRESSION:  Negative exercise stress test, did a good work load,  somewhat hypertensive response to exercise.     Noralyn Pick. Eden Emms, MD, [facility]  Electronically Signed    PCN/MedQ  DD: 03/18/2007  DT: 03/18/2007  Job #: [id number]
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
## 2011-02-03 LAB — COMPREHENSIVE METABOLIC PANEL
ALT: 34
AST: 48 — ABNORMAL HIGH
Albumin: 3.9
Alkaline Phosphatase: 61
BUN: 12
CO2: 28
Calcium: 9.2
Chloride: 110
Creatinine, Ser: 1.11
GFR calc Af Amer: >60
GFR calc non Af Amer: >60
Glucose, Bld: 115 — ABNORMAL HIGH
Potassium: 4.3
Sodium: 143
Total Bilirubin: 0.7
Total Protein: 6.8

## 2011-02-03 LAB — HEMOGLOBIN AND HEMATOCRIT, BLOOD
HCT: 45.5
Hemoglobin: 15.6

## 2011-02-10 LAB — POCT CARDIAC MARKERS
CKMB, poc: <1 — ABNORMAL LOW
CKMB, poc: <1 — ABNORMAL LOW
Myoglobin, poc: 102
Myoglobin, poc: 84.2
Operator id: 284251
Operator id: 284251
Troponin i, poc: <0.05
Troponin i, poc: <0.05

## 2011-02-10 LAB — DIFFERENTIAL
Basophils Absolute: 0.1
Basophils Relative: 1
Eosinophils Absolute: 0.4
Eosinophils Relative: 4
Lymphocytes Relative: 30
Lymphs Abs: 2.8
Monocytes Absolute: 0.9 — ABNORMAL HIGH
Monocytes Relative: 9
Neutro Abs: 5.4
Neutrophils Relative %: 57

## 2011-02-10 LAB — I-STAT 8, (EC8 V) (CONVERTED LAB)
Acid-Base Excess: 2
BUN: 15
Bicarbonate: 28.7 — ABNORMAL HIGH
Chloride: 106
Glucose, Bld: 101 — ABNORMAL HIGH
HCT: 50
Hemoglobin: 17
Operator id: 284251
Potassium: 3.7
Sodium: 141
TCO2: 30
pCO2, Ven: 50.1 — ABNORMAL HIGH
pH, Ven: 7.366 — ABNORMAL HIGH

## 2011-02-10 LAB — POCT I-STAT CREATININE
Creatinine, Ser: 1
Operator id: 284251

## 2011-02-10 LAB — CBC
HCT: 47.4
Hemoglobin: 16.2
MCHC: 34.2
MCV: 90.8
Platelets: 211
RBC: 5.22
RDW: 12.2
WBC: 9.5

## 2011-02-10 LAB — D-DIMER, QUANTITATIVE (NOT AT ARMC): D-Dimer, Quant: 0.31

## 2015-01-12 ENCOUNTER — Encounter (HOSPITAL_COMMUNITY): Payer: Self-pay | Admitting: *Deleted

## 2015-01-12 ENCOUNTER — Emergency Department (HOSPITAL_COMMUNITY)
Admission: EM | Admit: 2015-01-12 | Discharge: 2015-01-12 | Disposition: A | Discharge status: Home / Self Care | Payer: No Typology Code available for payment source | Attending: Emergency Medicine | Admitting: Emergency Medicine

## 2015-01-12 DIAGNOSIS — S39012A Strain of muscle, fascia and tendon of lower back, initial encounter: Secondary | ICD-10-CM | POA: Diagnosis not present

## 2015-01-12 DIAGNOSIS — S0990XA Unspecified injury of head, initial encounter: Secondary | ICD-10-CM | POA: Diagnosis not present

## 2015-01-12 DIAGNOSIS — I10 Essential (primary) hypertension: Secondary | ICD-10-CM | POA: Insufficient documentation

## 2015-01-12 DIAGNOSIS — Y9389 Activity, other specified: Secondary | ICD-10-CM | POA: Diagnosis not present

## 2015-01-12 DIAGNOSIS — Y92410 Unspecified street and highway as the place of occurrence of the external cause: Secondary | ICD-10-CM | POA: Diagnosis not present

## 2015-01-12 DIAGNOSIS — S3992XA Unspecified injury of lower back, initial encounter: Secondary | ICD-10-CM | POA: Diagnosis present

## 2015-01-12 DIAGNOSIS — Y998 Other external cause status: Secondary | ICD-10-CM | POA: Insufficient documentation

## 2015-01-12 HISTORY — DX: Essential (primary) hypertension: I10

## 2015-01-12 MED ORDER — TRAMADOL HCL 50 MG PO TABS: 50.0000 mg | ORAL_TABLET | Freq: Four times a day (QID) | ORAL | Status: DC | PRN

## 2015-01-12 MED ORDER — NAPROXEN 375 MG PO TABS: 375.0000 mg | ORAL_TABLET | Freq: Two times a day (BID) | ORAL | Status: DC

## 2015-01-12 MED ORDER — CYCLOBENZAPRINE HCL 10 MG PO TABS: 10.0000 mg | ORAL_TABLET | Freq: Two times a day (BID) | ORAL | Status: DC | PRN
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
## 2015-01-12 NOTE — Discharge Instructions (Signed)

## 2017-10-22 ENCOUNTER — Other Ambulatory Visit: Payer: Self-pay

## 2017-10-22 ENCOUNTER — Encounter: Payer: Self-pay | Admitting: Emergency Medicine

## 2017-10-22 ENCOUNTER — Ambulatory Visit (INDEPENDENT_AMBULATORY_CARE_PROVIDER_SITE_OTHER): Payer: Self-pay | Admitting: Emergency Medicine

## 2017-10-22 VITALS — BP 130/82 | HR 67 | Temp 98.1°F | Resp 16 | Ht 71.5 in | Wt 254.4 lb

## 2017-10-22 DIAGNOSIS — I10 Essential (primary) hypertension: Secondary | ICD-10-CM

## 2017-10-22 MED ORDER — METOPROLOL SUCCINATE ER 50 MG PO TB24: 50.0000 mg | ORAL_TABLET | Freq: Every day | ORAL | 1 refills | Status: DC

## 2017-10-22 NOTE — Progress Notes (Signed)
Joshua Forbes 62 y.o.   Chief Complaint  Patient presents with  . Medication Refill    Metoprolol - patient is from [place] and his doctor there will not refill Rx until he has an office visit    HISTORY OF PRESENT ILLNESS: This is a 62 y.o. male with a history of hypertension on metoprolol.  Here visiting from [place], needs prescription refill.  Has no complaints or medical concerns at this time. HPI   Prior to Admission medications   Medication Sig Start Date End Date Taking? Authorizing Provider  metoprolol succinate (TOPROL-XL) 50 MG 24 hr tablet Take 50 mg by mouth daily. Take with or immediately following a meal.   Yes [provider]  cyclobenzaprine (FLEXERIL) 10 MG tablet Take 1 tablet (10 mg total) by mouth 2 (two) times daily as needed for muscle spasms. Patient not taking: Reported on 10/22/2017 01/12/15   Margarita Mail, PA-C  naproxen (NAPROSYN) 375 MG tablet Take 1 tablet (375 mg total) by mouth 2 (two) times daily. Patient not taking: Reported on 10/22/2017 01/12/15   Margarita Mail, PA-C  traMADol (ULTRAM) 50 MG tablet Take 1 tablet (50 mg total) by mouth every 6 (six) hours as needed. Patient not taking: Reported on 10/22/2017 01/12/15   Margarita Mail, PA-C    Allergies  Allergen Reactions  . Lisinopril     REACTION: throat swelling    Patient Active Problem List   Diagnosis Date Noted  . OTITIS MEDIA 07/23/2010  . VISUAL ACUITY, DECREASED 06/10/2010  . FOREIGN BODY IN EAR 04/10/2010  . TINEA CRURIS 11/05/2009  . OBESITY 08/21/2009  . ANGIOEDEMA 07/23/2009  . MUCOCELE OF SALIVARY GLAND 05/17/2009  . NEPHROLITHIASIS 03/05/2009  . ANKLE PAIN, LEFT 01/01/2009  . BURSITIS, RIGHT SHOULDER 01/01/2009  . NEVI, MULTIPLE 05/21/2008  . DEPRESSION 05/21/2008  . LARYNGITIS, ACUTE 05/21/2008  . SKIN TAG 05/21/2008  . SLEEP DISORDER 05/21/2008  . HYPERTENSION, BENIGN ESSENTIAL 11/21/2007  . ALLERGIC RHINITIS 11/21/2007  . INGUINAL HERNIA, RIGHT  11/21/2007    Past Medical History:  Diagnosis Date  . Hypertension     Past Surgical History:  Procedure Laterality Date  . HERNIA REPAIR      Social History   Socioeconomic History  . Marital status: Single    Spouse name: Not on file  . Number of children: Not on file  . Years of education: Not on file  . Highest education level: Not on file  Occupational History  . Not on file  Social Needs  . Financial resource strain: Not on file  . Food insecurity:    Worry: Not on file    Inability: Not on file  . Transportation needs:    Medical: Not on file    Non-medical: Not on file  Tobacco Use  . Smoking status: Never Smoker  . Smokeless tobacco: Never Used  Substance and Sexual Activity  . Alcohol use: No  . Drug use: No  . Sexual activity: Not on file  Lifestyle  . Physical activity:    Days per week: Not on file    Minutes per session: Not on file  . Stress: Not on file  Relationships  . Social connections:    Talks on phone: Not on file    Gets together: Not on file    Attends religious service: Not on file    Active member of club or organization: Not on file    Attends meetings of clubs or organizations: Not on file  Relationship status: Not on file  . Intimate partner violence:    Fear of current or ex partner: Not on file    Emotionally abused: Not on file    Physically abused: Not on file    Forced sexual activity: Not on file  Other Topics Concern  . Not on file  Social History Narrative  . Not on file    No family history on file.   Review of Systems  Constitutional: Negative.  Negative for chills and fever.  HENT: Negative for sore throat.   Eyes: Negative.  Negative for blurred vision and double vision.  Respiratory: Negative.  Negative for cough and shortness of breath.   Cardiovascular: Negative.  Negative for chest pain and palpitations.  Gastrointestinal: Negative.  Negative for abdominal pain, nausea and vomiting.    Genitourinary: Negative.  Negative for dysuria and hematuria.  Musculoskeletal: Negative.  Negative for back pain, myalgias and neck pain.  Skin: Negative.  Negative for rash.  Neurological: Negative.  Negative for dizziness and headaches.  Endo/Heme/Allergies: Negative.   All other systems reviewed and are negative.   Vitals:   10/22/17 1206  BP: 130/82  Pulse: 67  Resp: 16  Temp: 98.1 F (36.7 C)  SpO2: 95%    Physical Exam  Constitutional: He is oriented to person, place, and time. He appears well-developed and well-nourished.  HENT:  Head: Normocephalic and atraumatic.  Eyes: Pupils are equal, round, and reactive to light. EOM are normal.  Neck: Normal range of motion. Neck supple. No JVD present.  Cardiovascular: Normal rate, regular rhythm and normal heart sounds.  Pulmonary/Chest: Effort normal and breath sounds normal.  Musculoskeletal: Normal range of motion. He exhibits no edema or tenderness.  Lymphadenopathy:    He has no cervical adenopathy.  Neurological: He is alert and oriented to person, place, and time. No sensory deficit. He exhibits normal muscle tone.  Skin: Skin is warm and dry. Capillary refill takes less than 2 seconds.  Psychiatric: He has a normal mood and affect. His behavior is normal.  Vitals reviewed.    ASSESSMENT & PLAN: Joshua Forbes was seen today for medication refill.  Diagnoses and all orders for this visit:  Essential hypertension -     metoprolol succinate (TOPROL-XL) 50 MG 24 hr tablet; Take 1 tablet (50 mg total) by mouth daily. Take with or immediately following a meal.    Patient Instructions       IF you received an x-ray today, you will receive an invoice from [facility]. Please contact [facility] at [phone] with questions or concerns regarding your invoice.   IF you received labwork today, you will receive an invoice from [facility]. Please contact [facility] at [phone] with questions or concerns  regarding your invoice.   Our billing staff will not be able to assist you with questions regarding bills from these companies.  You will be contacted with the lab results as soon as they are available. The fastest way to get your results is to activate your My Chart account. Instructions are located on the last page of this paperwork. If you have not heard from [place] regarding the results in 2 weeks, please contact this office.     Hypertension Hypertension, commonly called high blood pressure, is when the force of blood pumping through the arteries is too strong. The arteries are the blood vessels that carry blood from the heart throughout the body. Hypertension forces the heart to work harder to pump blood and may cause  arteries to become narrow or stiff. Having untreated or uncontrolled hypertension can cause heart attacks, strokes, kidney disease, and other problems. A blood pressure reading consists of a higher number over a lower number. Ideally, your blood pressure should be below 120/80. The first ("top") number is called the systolic pressure. It is a measure of the pressure in your arteries as your heart beats. The second ("bottom") number is called the diastolic pressure. It is a measure of the pressure in your arteries as the heart relaxes. What are the causes? The cause of this condition is not known. What increases the risk? Some risk factors for high blood pressure are under your control. Others are not. Factors you can change  Smoking.  Having type 2 diabetes mellitus, high cholesterol, or both.  Not getting enough exercise or physical activity.  Being overweight.  Having too much fat, sugar, calories, or salt (sodium) in your diet.  Drinking too much alcohol. Factors that are difficult or impossible to change  Having chronic kidney disease.  Having a family history of high blood pressure.  Age. Risk increases with age.  Race. You may be at higher risk if you are  African-American.  Gender. Men are at higher risk than women before age 2. After age 2, women are at higher risk than men.  Having obstructive sleep apnea.  Stress. What are the signs or symptoms? Extremely high blood pressure (hypertensive crisis) may cause:  Headache.  Anxiety.  Shortness of breath.  Nosebleed.  Nausea and vomiting.  Severe chest pain.  Jerky movements you cannot control (seizures).  How is this diagnosed? This condition is diagnosed by measuring your blood pressure while you are seated, with your arm resting on a surface. The cuff of the blood pressure monitor will be placed directly against the skin of your upper arm at the level of your heart. It should be measured at least twice using the same arm. Certain conditions can cause a difference in blood pressure between your right and left arms. Certain factors can cause blood pressure readings to be lower or higher than normal (elevated) for a short period of time:  When your blood pressure is higher when you are in a health care provider's office than when you are at home, this is called white coat hypertension. Most people with this condition do not need medicines.  When your blood pressure is higher at home than when you are in a health care provider's office, this is called masked hypertension. Most people with this condition may need medicines to control blood pressure.  If you have a high blood pressure reading during one visit or you have normal blood pressure with other risk factors:  You may be asked to return on a different day to have your blood pressure checked again.  You may be asked to monitor your blood pressure at home for 1 week or longer.  If you are diagnosed with hypertension, you may have other blood or imaging tests to help your health care provider understand your overall risk for other conditions. How is this treated? This condition is treated by making healthy lifestyle changes,  such as eating healthy foods, exercising more, and reducing your alcohol intake. Your health care provider may prescribe medicine if lifestyle changes are not enough to get your blood pressure under control, and if:  Your systolic blood pressure is above 130.  Your diastolic blood pressure is above 80.  Your personal target blood pressure may vary depending on  your medical conditions, your age, and other factors. Follow these instructions at home: Eating and drinking  Eat a diet that is high in fiber and potassium, and low in sodium, added sugar, and fat. An example eating plan is called the DASH (Dietary Approaches to Stop Hypertension) diet. To eat this way: ? Eat plenty of fresh fruits and vegetables. Try to fill half of your plate at each meal with fruits and vegetables. ? Eat whole grains, such as whole wheat pasta, brown rice, or whole grain bread. Fill about one quarter of your plate with whole grains. ? Eat or drink low-fat dairy products, such as skim milk or low-fat yogurt. ? Avoid fatty cuts of meat, processed or cured meats, and poultry with skin. Fill about one quarter of your plate with lean proteins, such as fish, chicken without skin, beans, eggs, and tofu. ? Avoid premade and processed foods. These tend to be higher in sodium, added sugar, and fat.  Reduce your daily sodium intake. Most people with hypertension should eat less than 1,500 mg of sodium a day.  Limit alcohol intake to no more than 1 drink a day for nonpregnant women and 2 drinks a day for men. One drink equals 12 oz of beer, 5 oz of wine, or 1 oz of hard liquor. Lifestyle  Work with your health care provider to maintain a healthy body weight or to lose weight. Ask what an ideal weight is for you.  Get at least 30 minutes of exercise that causes your heart to beat faster (aerobic exercise) most days of the week. Activities may include walking, swimming, or biking.  Include exercise to strengthen your muscles  (resistance exercise), such as pilates or lifting weights, as part of your weekly exercise routine. Try to do these types of exercises for 30 minutes at least 3 days a week.  Do not use any products that contain nicotine or tobacco, such as cigarettes and e-cigarettes. If you need help quitting, ask your health care provider.  Monitor your blood pressure at home as told by your health care provider.  Keep all follow-up visits as told by your health care provider. This is important. Medicines  Take over-the-counter and prescription medicines only as told by your health care provider. Follow directions carefully. Blood pressure medicines must be taken as prescribed.  Do not skip doses of blood pressure medicine. Doing this puts you at risk for problems and can make the medicine less effective.  Ask your health care provider about side effects or reactions to medicines that you should watch for. Contact a health care provider if:  You think you are having a reaction to a medicine you are taking.  You have headaches that keep coming back (recurring).  You feel dizzy.  You have swelling in your ankles.  You have trouble with your vision. Get help right away if:  You develop a severe headache or confusion.  You have unusual weakness or numbness.  You feel faint.  You have severe pain in your chest or abdomen.  You vomit repeatedly.  You have trouble breathing. Summary  Hypertension is when the force of blood pumping through your arteries is too strong. If this condition is not controlled, it may put you at risk for serious complications.  Your personal target blood pressure may vary depending on your medical conditions, your age, and other factors. For most people, a normal blood pressure is less than 120/80.  Hypertension is treated with lifestyle changes, medicines, or  a combination of both. Lifestyle changes include weight loss, eating a healthy, low-sodium diet, exercising  more, and limiting alcohol. This information is not intended to replace advice given to you by your health care provider. Make sure you discuss any questions you have with your health care provider. Document Released: 04/20/2005 Document Revised: 03/18/2016 Document Reviewed: 03/18/2016 Elsevier Interactive Patient Education  2018 Elsevier Inc.      Agustina Caroli, MD [facility]

## 2017-10-22 NOTE — Patient Instructions (Addendum)
   IF you received an x-ray today, you will receive an invoice from [facility]. Please contact [facility] at [phone] with questions or concerns regarding your invoice.   IF you received labwork today, you will receive an invoice from [facility]. Please contact [facility] at [phone] with questions or concerns regarding your invoice.   Our billing staff will not be able to assist you with questions regarding bills from these companies.  You will be contacted with the lab results as soon as they are available. The fastest way to get your results is to activate your My Chart account. Instructions are located on the last page of this paperwork. If you have not heard from us regarding the results in 2 weeks, please contact this office.     Hypertension Hypertension, commonly called high blood pressure, is when the force of blood pumping through the arteries is too strong. The arteries are the blood vessels that carry blood from the heart throughout the body. Hypertension forces the heart to work harder to pump blood and may cause arteries to become narrow or stiff. Having untreated or uncontrolled hypertension can cause heart attacks, strokes, kidney disease, and other problems. A blood pressure reading consists of a higher number over a lower number. Ideally, your blood pressure should be below 120/80. The first ("top") number is called the systolic pressure. It is a measure of the pressure in your arteries as your heart beats. The second ("bottom") number is called the diastolic pressure. It is a measure of the pressure in your arteries as the heart relaxes. What are the causes? The cause of this condition is not known. What increases the risk? Some risk factors for high blood pressure are under your control. Others are not. Factors you can change  Smoking.  Having type 2 diabetes mellitus, high cholesterol, or both.  Not getting enough exercise or physical  activity.  Being overweight.  Having too much fat, sugar, calories, or salt (sodium) in your diet.  Drinking too much alcohol. Factors that are difficult or impossible to change  Having chronic kidney disease.  Having a family history of high blood pressure.  Age. Risk increases with age.  Race. You may be at higher risk if you are African-American.  Gender. Men are at higher risk than women before age 45. After age 65, women are at higher risk than men.  Having obstructive sleep apnea.  Stress. What are the signs or symptoms? Extremely high blood pressure (hypertensive crisis) may cause:  Headache.  Anxiety.  Shortness of breath.  Nosebleed.  Nausea and vomiting.  Severe chest pain.  Jerky movements you cannot control (seizures).  How is this diagnosed? This condition is diagnosed by measuring your blood pressure while you are seated, with your arm resting on a surface. The cuff of the blood pressure monitor will be placed directly against the skin of your upper arm at the level of your heart. It should be measured at least twice using the same arm. Certain conditions can cause a difference in blood pressure between your right and left arms. Certain factors can cause blood pressure readings to be lower or higher than normal (elevated) for a short period of time:  When your blood pressure is higher when you are in a health care provider's office than when you are at home, this is called white coat hypertension. Most people with this condition do not need medicines.  When your blood pressure is higher at home than when you   are in a health care provider's office, this is called masked hypertension. Most people with this condition may need medicines to control blood pressure.  If you have a high blood pressure reading during one visit or you have normal blood pressure with other risk factors:  You may be asked to return on a different day to have your blood pressure  checked again.  You may be asked to monitor your blood pressure at home for 1 week or longer.  If you are diagnosed with hypertension, you may have other blood or imaging tests to help your health care provider understand your overall risk for other conditions. How is this treated? This condition is treated by making healthy lifestyle changes, such as eating healthy foods, exercising more, and reducing your alcohol intake. Your health care provider may prescribe medicine if lifestyle changes are not enough to get your blood pressure under control, and if:  Your systolic blood pressure is above 130.  Your diastolic blood pressure is above 80.  Your personal target blood pressure may vary depending on your medical conditions, your age, and other factors. Follow these instructions at home: Eating and drinking  Eat a diet that is high in fiber and potassium, and low in sodium, added sugar, and fat. An example eating plan is called the DASH (Dietary Approaches to Stop Hypertension) diet. To eat this way: ? Eat plenty of fresh fruits and vegetables. Try to fill half of your plate at each meal with fruits and vegetables. ? Eat whole grains, such as whole wheat pasta, brown rice, or whole grain bread. Fill about one quarter of your plate with whole grains. ? Eat or drink low-fat dairy products, such as skim milk or low-fat yogurt. ? Avoid fatty cuts of meat, processed or cured meats, and poultry with skin. Fill about one quarter of your plate with lean proteins, such as fish, chicken without skin, beans, eggs, and tofu. ? Avoid premade and processed foods. These tend to be higher in sodium, added sugar, and fat.  Reduce your daily sodium intake. Most people with hypertension should eat less than 1,500 mg of sodium a day.  Limit alcohol intake to no more than 1 drink a day for nonpregnant women and 2 drinks a day for men. One drink equals 12 oz of beer, 5 oz of wine, or 1 oz of hard  liquor. Lifestyle  Work with your health care provider to maintain a healthy body weight or to lose weight. Ask what an ideal weight is for you.  Get at least 30 minutes of exercise that causes your heart to beat faster (aerobic exercise) most days of the week. Activities may include walking, swimming, or biking.  Include exercise to strengthen your muscles (resistance exercise), such as pilates or lifting weights, as part of your weekly exercise routine. Try to do these types of exercises for 30 minutes at least 3 days a week.  Do not use any products that contain nicotine or tobacco, such as cigarettes and e-cigarettes. If you need help quitting, ask your health care provider.  Monitor your blood pressure at home as told by your health care provider.  Keep all follow-up visits as told by your health care provider. This is important. Medicines  Take over-the-counter and prescription medicines only as told by your health care provider. Follow directions carefully. Blood pressure medicines must be taken as prescribed.  Do not skip doses of blood pressure medicine. Doing this puts you at risk for problems and   can make the medicine less effective.  Ask your health care provider about side effects or reactions to medicines that you should watch for. Contact a health care provider if:  You think you are having a reaction to a medicine you are taking.  You have headaches that keep coming back (recurring).  You feel dizzy.  You have swelling in your ankles.  You have trouble with your vision. Get help right away if:  You develop a severe headache or confusion.  You have unusual weakness or numbness.  You feel faint.  You have severe pain in your chest or abdomen.  You vomit repeatedly.  You have trouble breathing. Summary  Hypertension is when the force of blood pumping through your arteries is too strong. If this condition is not controlled, it may put you at risk for serious  complications.  Your personal target blood pressure may vary depending on your medical conditions, your age, and other factors. For most people, a normal blood pressure is less than 120/80.  Hypertension is treated with lifestyle changes, medicines, or a combination of both. Lifestyle changes include weight loss, eating a healthy, low-sodium diet, exercising more, and limiting alcohol. This information is not intended to replace advice given to you by your health care provider. Make sure you discuss any questions you have with your health care provider. Document Released: 04/20/2005 Document Revised: 03/18/2016 Document Reviewed: 03/18/2016 Elsevier Interactive Patient Education  2018 [employer] Inc.  

## 2018-02-09 ENCOUNTER — Encounter: Payer: Self-pay | Admitting: Family Medicine

## 2018-02-10 ENCOUNTER — Ambulatory Visit (INDEPENDENT_AMBULATORY_CARE_PROVIDER_SITE_OTHER): Payer: Self-pay | Admitting: Family Medicine

## 2018-02-10 ENCOUNTER — Encounter: Payer: Self-pay | Admitting: Family Medicine

## 2018-02-10 VITALS — BP 124/79 | HR 53 | Temp 97.9°F | Resp 17 | Ht 71.75 in | Wt 234.0 lb

## 2018-02-10 DIAGNOSIS — Z13220 Encounter for screening for lipoid disorders: Secondary | ICD-10-CM

## 2018-02-10 DIAGNOSIS — I10 Essential (primary) hypertension: Secondary | ICD-10-CM

## 2018-02-10 DIAGNOSIS — R7303 Prediabetes: Secondary | ICD-10-CM

## 2018-02-10 MED ORDER — METOPROLOL SUCCINATE ER 50 MG PO TB24: 50.0000 mg | ORAL_TABLET | Freq: Every day | ORAL | 1 refills | Status: DC

## 2018-02-10 MED ORDER — METFORMIN HCL 500 MG PO TABS: 500.0000 mg | ORAL_TABLET | Freq: Two times a day (BID) | ORAL | 2 refills | Status: DC

## 2018-02-10 MED FILL — metFORMIN HCL 500 MG TABS: 500 | 30 days supply | Qty: 60 | Fill #0

## 2018-02-10 MED FILL — METOPROLOL SUCCINATE ER 50: 50 | 30 days supply | Qty: 30 | Fill #0

## 2018-02-10 NOTE — Progress Notes (Signed)
Joshua Forbes, is a 62 y.o. male  XIH:[id number]  LKJ:[id number]  DOB - 1955/08/01  CC:  Chief Complaint  Patient presents with  . Establish Care  . Hypertension    doesn't check BP. watching salt intake. denies chest pain, SHOB, lower extremity swelling, headaches.  . Diabetes    has prediabetes. has been mindful of eating habits. states that he's lost 30 lbs since the change       HPI:  Joshua Forbes is a 62 y.o. male is here today to establish care. Chronic health problems include:has NEVI, MULTIPLE; OBESITY; DEPRESSION; Essential hypertension; ALLERGIC RHINITIS; INGUINAL HERNIA, RIGHT; SKIN TAG; and SLEEP DISORDER.   Hypertension Joshua Forbes reports no home monitoring of blood pressure. Reports adherence to blood pressure medications although notes he takes 50 mg of metoprolol twice daily opposed to the prescribed dose of metoprolol 50 mg once daily.  Metoprolol that he is prescribed is extended release. Reports efforts to adhere to low sodium diet. He is a nonsmoker. Denies any episodes of dizziness,headaches, shortness of breath, or chest pain.  Previously seen a medical provider in [place], now lives in [place]. He reports being started on metformin made of prediabetes.  Uncertain of last V9Y.  He denies any polyuria, polydipsia, polyphagia or neuropathic pain.  He considers himself very healthy.  Current medications: Current Outpatient Medications:  .  metoprolol succinate (TOPROL-XL) 50 MG 24 hr tablet, Take 1 tablet (50 mg total) by mouth daily. Take with or immediately following a meal., Disp: 60 tablet, Rfl: 1   Pertinent family medical history: family history includes Colon cancer in his father; Diabetes in his brother.   Allergies  Allergen Reactions  . Lisinopril     REACTION: throat swelling    Social History   Socioeconomic History  . Marital status: Single    Spouse name: Not on file  . Number of children: Not on file  . Years of education: Not on file  .  Highest education level: Not on file  Occupational History  . Not on file  Social Needs  . Financial resource strain: Not on file  . Food insecurity:    Worry: Not on file    Inability: Not on file  . Transportation needs:    Medical: Not on file    Non-medical: Not on file  Tobacco Use  . Smoking status: Never Smoker  . Smokeless tobacco: Never Used  Substance and Sexual Activity  . Alcohol use: No  . Drug use: No  . Sexual activity: Not on file  Lifestyle  . Physical activity:    Days per week: Not on file    Minutes per session: Not on file  . Stress: Not on file  Relationships  . Social connections:    Talks on phone: Not on file    Gets together: Not on file    Attends religious service: Not on file    Active member of club or organization: Not on file    Attends meetings of clubs or organizations: Not on file    Relationship status: Not on file  . Intimate partner violence:    Fear of current or ex partner: Not on file    Emotionally abused: Not on file    Physically abused: Not on file    Forced sexual activity: Not on file  Other Topics Concern  . Not on file  Social History Narrative  . Not on file    Review of Systems: Upon review of systems  all systems negative.  Objective:   Vitals:   02/10/18 0952  BP: 124/79  Pulse: (!) 53  Resp: 17  Temp: 97.9 F (36.6 C)  SpO2: 94%    BP Readings from Last 3 Encounters:  10/22/17 130/82  01/12/15 122/87  07/23/10 128/84    Filed Weights   02/10/18 0952  Weight: 234 lb (106.1 kg)      Physical Exam: Constitutional: Patient appears well-developed and well-nourished. No distress. HENT: Normocephalic, atraumatic, External right and left ear normal. Eyes: Conjunctivae and EOM are normal. PERRLA, no scleral icterus. Neck: Normal ROM. Neck supple. No JVD. No tracheal deviation. No thyromegaly. CVS: RRR, S1/S2 +, no murmurs, no gallops, no carotid bruit.  Pulmonary: Effort and breath sounds normal, no  stridor, rhonchi, wheezes, rales.  Abdominal: Soft. BS +, no distension, tenderness, rebound or guarding.  Musculoskeletal: Normal range of motion. No edema and no tenderness.  Neuro: Alert. Normal muscle tone coordination.  Skin: Skin is warm and dry. No rash noted. Not diaphoretic. No erythema. No pallor. Psychiatric: Normal mood and affect. Behavior, judgment, thought content normal. Lab Results  Component Value Date   WBC 7.0 01/01/2009   HGB 15.4 01/01/2009   HCT 45.2 01/01/2009   MCV 89.7 01/01/2009   PLT 189 01/01/2009   Lab Results  Component Value Date   CREATININE 1.21 08/21/2009   BUN 23 08/21/2009   NA 143 08/21/2009   K 3.9 08/21/2009   CL 106 08/21/2009   CO2 25 08/21/2009    No results found for: HGBA1C  Lipid Panel     Component Value Date/Time   CHOL 160 01/01/2009 0000   TRIG 79 01/01/2009 0000   HDL 39 (L) 01/01/2009 0000   CHOLHDL 4.1 Ratio 01/01/2009 0000   VLDL 16 01/01/2009 0000   LDLCALC 105 (H) 01/01/2009 0000        Assessment and plan:  1. Prediabetes, will obtain fasting CMP evaluate renal, liver, glucose functioning.   Checking A1C. -Continue metformin at current dose for now as he is tolerating medication without complications.  2. Screening, lipid, no history of hyperlipidemia - Thyroid Panel With TSH; Future - Lipid panel; Future  3. Essential hypertension, controlled.  Of concern patient's heart rate is in the low 50s likely secondary to the 100 mg of metoprolol that he is taking. Reduce the metoprolol to 50 mg once daily as originally prescribed.  I will have patient return in 2 weeks for a blood pressure and pulse check.  Remains abnormally low will consider DC on metoprolol and starting amlodipine.   2-week follow-up for pulse and blood pressure check.   The patient was given clear instructions to go to ER or return to medical center if symptoms don't improve, worsen or new problems develop. The patient verbalized understanding.  The patient was told to call to get lab results if they haven't heard anything in the next week.    Molli Barrows, FNP Primary Care at [facility] [address], [place] [address] [phone]: [phone]    This note has been created with Dragon speech recognition software and Engineer, materials. Any transcriptional errors are unintentional.

## 2018-02-10 NOTE — Patient Instructions (Addendum)
Review medication list attached and take all medications as prescribed today.  Go to the following to have labs drawn: [facility] and [address], [place], [place] [address] [phone]   Thank you for choosing Primary Care at [facility] for your medical home!    [name] was seen by Molli Barrows, FNP today.   Shelbie Ammons primary care doctor is Scot Jun, FNP.   For the best care possible,  you should try to see Molli Barrows, FNP  whenever you come to clinic.   We look forward to seeing you again soon!  If you have any questions about your visit today,  please call us at   Or feel free to reach your provider via [place].

## 2018-02-11 ENCOUNTER — Ambulatory Visit: Payer: Self-pay | Attending: Family Medicine

## 2018-02-11 DIAGNOSIS — Z13220 Encounter for screening for lipoid disorders: Secondary | ICD-10-CM | POA: Insufficient documentation

## 2018-02-11 DIAGNOSIS — R7303 Prediabetes: Secondary | ICD-10-CM | POA: Insufficient documentation

## 2018-02-12 LAB — COMPREHENSIVE METABOLIC PANEL
ALT: 10 IU/L (ref 0–44)
AST: 31 IU/L (ref 0–40)
Albumin/Globulin Ratio: 1.8 (ref 1.2–2.2)
Albumin: 4.4 g/dL (ref 3.6–4.8)
Alkaline Phosphatase: 47 IU/L (ref 39–117)
BUN/Creatinine Ratio: 19 (ref 10–24)
BUN: 17 mg/dL (ref 8–27)
Bilirubin Total: 0.8 mg/dL (ref 0.0–1.2)
CO2: 23 mmol/L (ref 20–29)
Calcium: 9.2 mg/dL (ref 8.6–10.2)
Chloride: 101 mmol/L (ref 96–106)
Creatinine, Ser: 0.88 mg/dL (ref 0.76–1.27)
GFR calc Af Amer: 108 mL/min/{1.73_m2} (ref >59)
GFR calc non Af Amer: 93 mL/min/{1.73_m2} (ref >59)
Globulin, Total: 2.4 g/dL (ref 1.5–4.5)
Glucose: 90 mg/dL (ref 65–99)
Potassium: 4.1 mmol/L (ref 3.5–5.2)
Sodium: 140 mmol/L (ref 134–144)
Total Protein: 6.8 g/dL (ref 6.0–8.5)

## 2018-02-12 LAB — CBC WITH DIFFERENTIAL/PLATELET
Basophils Absolute: 0.1 10*3/uL (ref 0.0–0.2)
Basos: 1 %
EOS (ABSOLUTE): 0.2 10*3/uL (ref 0.0–0.4)
Eos: 3 %
Hematocrit: 44.2 % (ref 37.5–51.0)
Hemoglobin: 14.7 g/dL (ref 13.0–17.7)
Immature Grans (Abs): 0 10*3/uL (ref 0.0–0.1)
Immature Granulocytes: 0 %
Lymphocytes Absolute: 1.1 10*3/uL (ref 0.7–3.1)
Lymphs: 21 %
MCH: 30.6 pg (ref 26.6–33.0)
MCHC: 33.3 g/dL (ref 31.5–35.7)
MCV: 92 fL (ref 79–97)
Monocytes Absolute: 0.5 10*3/uL (ref 0.1–0.9)
Monocytes: 9 %
Neutrophils Absolute: 3.7 10*3/uL (ref 1.4–7.0)
Neutrophils: 66 %
Platelets: 194 10*3/uL (ref 150–450)
RBC: 4.81 x10E6/uL (ref 4.14–5.80)
RDW: 12.5 % (ref 12.3–15.4)
WBC: 5.6 10*3/uL (ref 3.4–10.8)

## 2018-02-12 LAB — LIPID PANEL
Chol/HDL Ratio: 4.4 ratio (ref 0.0–5.0)
Cholesterol, Total: 177 mg/dL (ref 100–199)
HDL: 40 mg/dL (ref >39)
LDL Calculated: 126 mg/dL — ABNORMAL HIGH (ref 0–99)
Triglycerides: 56 mg/dL (ref 0–149)
VLDL Cholesterol Cal: 11 mg/dL (ref 5–40)

## 2018-02-12 LAB — THYROID PANEL WITH TSH
Free Thyroxine Index: 1.9 (ref 1.2–4.9)
T3 Uptake Ratio: 29 % (ref 24–39)
T4, Total: 6.6 ug/dL (ref 4.5–12.0)
TSH: 2.64 u[IU]/mL (ref 0.450–4.500)

## 2018-02-12 LAB — HEMOGLOBIN A1C
Est. average glucose Bld gHb Est-mCnc: 108 mg/dL
Hgb A1c MFr Bld: 5.4 % (ref 4.8–5.6)

## 2018-03-11 ENCOUNTER — Ambulatory Visit (INDEPENDENT_AMBULATORY_CARE_PROVIDER_SITE_OTHER): Payer: Self-pay | Admitting: Family Medicine

## 2018-03-11 VITALS — BP 111/71 | HR 58

## 2018-03-11 DIAGNOSIS — I10 Essential (primary) hypertension: Secondary | ICD-10-CM

## 2018-03-11 MED ORDER — METOPROLOL SUCCINATE ER 25 MG PO TB24: 25.0000 mg | ORAL_TABLET | Freq: Every day | ORAL | 3 refills | Status: DC

## 2018-03-11 NOTE — Progress Notes (Signed)
D/C metoprolol 50 mg XL and start metoprolol 25 mg XL  Return in 2 months

## 2018-03-11 NOTE — Progress Notes (Signed)
Patient here for BP check. Denies chest pain, SHOB, lower extremity swelling, headaches. Hasn't checked BP since last visit. BP was 11/71 & pulse was 58. Spoke with provider who adjusted Metoprolol dose. Wants patient to follow up in 2 months. KWalker, CMA.

## 2018-03-29 MED FILL — metFORMIN HCL 500 MG TABS: 500 | 30 days supply | Qty: 60 | Fill #1

## 2018-04-07 MED FILL — METOPROLOL SUCCINATE ER 25: 25 | 30 days supply | Qty: 30 | Fill #0

## 2018-05-11 ENCOUNTER — Ambulatory Visit: Payer: Self-pay | Admitting: Family Medicine

## 2018-05-23 MED FILL — metFORMIN HCL 500 MG TABS: 500 | 30 days supply | Qty: 60 | Fill #2

## 2018-06-20 MED FILL — METOPROLOL SUCCINATE ER 25: 25 | 30 days supply | Qty: 30 | Fill #1

## 2018-06-21 MED FILL — metFORMIN HCL 500 MG TABS: 500 | 30 days supply | Qty: 60 | Fill #3

## 2018-07-14 MED FILL — metFORMIN HCL 500 MG TABS: 500 | 30 days supply | Qty: 0 | Fill #4

## 2018-08-02 ENCOUNTER — Other Ambulatory Visit: Payer: Self-pay

## 2018-08-02 MED ORDER — METFORMIN HCL 500 MG PO TABS: 500.0000 mg | ORAL_TABLET | Freq: Two times a day (BID) | ORAL | 0 refills | Status: DC

## 2020-02-20 ENCOUNTER — Encounter: Payer: Self-pay | Admitting: *Deleted

## 2020-04-01 ENCOUNTER — Ambulatory Visit: Payer: 59

## 2020-04-09 ENCOUNTER — Ambulatory Visit (INDEPENDENT_AMBULATORY_CARE_PROVIDER_SITE_OTHER): Payer: Self-pay | Admitting: *Deleted

## 2020-04-09 ENCOUNTER — Other Ambulatory Visit: Payer: Self-pay

## 2020-04-09 VITALS — Ht 71.75 in | Wt 225.8 lb

## 2020-04-09 DIAGNOSIS — Z1211 Encounter for screening for malignant neoplasm of colon: Secondary | ICD-10-CM

## 2020-04-09 MED ORDER — NA SULFATE-K SULFATE-MG SULF 17.5-3.13-1.6 GM/177ML PO SOLN: 1.0000 | Freq: Once | ORAL | 0 refills | Status: AC

## 2020-04-09 NOTE — Progress Notes (Addendum)
Gastroenterology Pre-Procedure Review  Request Date: 04/09/2020 Requesting Physician: Dr. Anastasia Pall, Pt says last TCS done 10 years ago in [place], no polyps per pt, family hx of colon cancer (father)  PATIENT REVIEW QUESTIONS: The patient responded to the following health history questions as indicated:    1. Diabetes Melitis: yes, pre-diabetic but controlled without medicine 2. Joint replacements in the past 12 months: no 3. Major health problems in the past 3 months: no 4. Has an artificial valve or MVP: no 5. Has a defibrillator: no 6. Has been advised in past to take antibiotics in advance of a procedure like teeth cleaning: no 7. Family history of colon cancer: yes, father: age 31  8. Alcohol Use: no 9. Illicit drug Use: no 10. History of sleep apnea: yes, not on CPAP  11. History of coronary artery or other vascular stents placed within the last 12 months: no 12. History of any prior anesthesia complications: no 13. Body mass index is 30.84 kg/m.    MEDICATIONS & ALLERGIES:    Patient reports the following regarding taking any blood thinners:   Plavix? no Aspirin? no Coumadin? no Brilinta? no Xarelto? no Eliquis? no Pradaxa? no Savaysa? no Effient? no  Patient confirms/reports the following medications:  Current Outpatient Medications  Medication Sig Dispense Refill  . losartan (COZAAR) 50 MG tablet Take 50 mg by mouth daily.    . tamsulosin (FLOMAX) 0.4 MG CAPS capsule Take 0.4 mg by mouth every other day.     No current facility-administered medications for this visit.    Patient confirms/reports the following allergies:  Allergies  Allergen Reactions  . Lisinopril Swelling    throat swelling    No orders of the defined types were placed in this encounter.   AUTHORIZATION INFORMATION Primary Insurance: [facility],  ID #:[id number] ,  Group #: [id number] Pre-Cert / Auth required: No, not required per fax Pre-Cert / Auth #: Ref#: [id number]  SCHEDULE  INFORMATION: Procedure has been scheduled as follows:  Date: 05/17/2020, Time: 12:45 Location: [facility] with Dr. Abbey Chatters  This Gastroenterology Pre-Precedure Review Form is being routed to the following provider(s): Walden Field, NP

## 2020-04-09 NOTE — Patient Instructions (Signed)
Joshua Forbes  10/03/1955 MRN: [id number]     Procedure Date: 05/17/2020 Time to register: 11:15 am Place to register: [place] [facility] Procedure Time: 12:45 pm Scheduled provider: Dr. Abbey Chatters    PREPARATION FOR COLONOSCOPY WITH SUPREP BOWEL PREP KIT  Note: Suprep Bowel Prep Kit is a split-dose (2day) regimen. Consumption of BOTH 6-ounce bottles is required for a complete prep.  Please notify us immediately if you are diabetic, take iron supplements, or if you are on Coumadin or any other blood thinners.  Please hold the following medications: n/a                                                                                                                                                  2 DAYS BEFORE PROCEDURE:  DATE: 05/15/2020   DAY: Wednesday Begin clear liquid diet AFTER your lunch meal. NO SOLID FOODS after this point.  1 DAY BEFORE PROCEDURE:  DATE: 05/16/2020   DAY: Thursday Continue clear liquids the entire day - NO SOLID FOOD.   Diabetic medications adjustments for today: n/a  At 6:00pm: Complete steps 1 through 4 below, using ONE (1) 6-ounce bottle, before going to bed. Step 1:  Pour ONE (1) 6-ounce bottle of SUPREP liquid into the mixing container.  Step 2:  Add cool drinking water to the 16 ounce line on the container and mix.  Note: Dilute the solution concentrate as directed prior to use. Step 3:  DRINK ALL the liquid in the container. Step 4:  You MUST drink an additional two (2) or more 16 ounce containers of water over the next one (1) hour.   Continue clear liquids.  DAY OF PROCEDURE:   DATE: 05/17/2020   DAY: Friday If you take medications for your heart, blood pressure, or breathing, you may take these medications.  Diabetic medications adjustments for today: n/a  5 hours before your procedure at :  7:45 am Step 1:  Pour ONE (1) 6-ounce bottle of SUPREP liquid into the mixing container.  Step 2:  Add cool drinking water to the 16 ounce line on the  container and mix.  Note: Dilute the solution concentrate as directed prior to use. Step 3:  DRINK ALL the liquid in the container. Step 4:  You MUST drink an additional two (2) or more 16 ounce containers of water over the next one (1) hour. You MUST complete the final glass of water at least 3 hours before your colonoscopy. Nothing by mouth past 9:45 am.  You may take your morning medications with sip of water unless we have instructed otherwise.    Please see below for Dietary Information.  CLEAR LIQUIDS INCLUDE:  Water Jello (NOT red in color)   Ice Popsicles (NOT red in color)   Tea (sugar ok, no milk/cream) Powdered fruit flavored drinks  Coffee (sugar  ok, no milk/cream) Gatorade/ Lemonade/ Kool-Aid  (NOT red in color)   °Juice: apple, white grape, white cranberry Soft drinks  °Clear bullion, consomme, broth (fat free beef/chicken/vegetable)  Carbonated beverages (any kind)  °Strained chicken noodle soup Hard Candy  ° °Remember: Clear liquids are liquids that will allow you to see your fingers on the other side of a clear glass. Be sure liquids are NOT red in color, and not cloudy, but CLEAR. ° °DO NOT EAT OR DRINK ANY OF THE FOLLOWING: ° °Dairy products of any kind   Cranberry juice °Tomato juice / V8 juice   Grapefruit juice °Orange juice     Red grape juice ° °Do not eat any solid foods, including such foods as: cereal, oatmeal, yogurt, fruits, vegetables, creamed soups, eggs, bread, crackers, pureed foods in a blender, etc.  ° °HELPFUL HINTS FOR DRINKING PREP SOLUTION: ° °· Make sure prep is extremely cold. Mix and refrigerate the the morning of the prep. You may also put in the freezer.  °· You may try mixing some Crystal Light or Country Time Lemonade if you prefer. Mix in small amounts; add more if necessary. °· Try drinking through a straw °· Rinse mouth with water or a mouthwash between glasses, to remove after-taste. °· Try sipping on a cold beverage /ice/ popsicles between glasses of  prep. °· Place a piece of sugar-free hard candy in mouth between glasses. °· If you become nauseated, try consuming smaller amounts, or stretch out the time between glasses. Stop for 30-60 minutes, then slowly start back drinking.  ° ° ° °OTHER INSTRUCTIONS ° °You will need a responsible adult at least 62 years of age to accompany you and drive you home. This person must remain in the waiting room during your procedure. The hospital will cancel your procedure if you do not have a responsible adult with you.  ° °1. Wear loose fitting clothing that is easily removed. °2. Leave jewelry and other valuables at home.  °3. Remove all body piercing jewelry and leave at home. °4. Total time from sign-in until discharge is approximately 2-3 hours. °5. You should go home directly after your procedure and rest. You can resume normal activities the day after your procedure. °6. The day of your procedure you should not: °· Drive °· Make legal decisions °· Operate machinery °· Drink alcohol °· Return to work ° ° °You may call the office (Dept: [phone]) before 5:00pm, or page the doctor on call ([phone]) after 5:00pm, for further instructions, if necessary. ° ° °Insurance Information °YOU WILL NEED TO CHECK WITH YOUR INSURANCE COMPANY FOR THE BENEFITS OF COVERAGE YOU HAVE FOR THIS PROCEDURE.  UNFORTUNATELY, NOT ALL INSURANCE COMPANIES HAVE BENEFITS TO COVER ALL OR PART OF THESE TYPES OF PROCEDURES.  IT IS YOUR RESPONSIBILITY TO CHECK YOUR BENEFITS, HOWEVER, WE WILL BE GLAD TO ASSIST YOU WITH ANY CODES YOUR INSURANCE COMPANY MAY NEED.   ° °PLEASE NOTE THAT MOST INSURANCE COMPANIES WILL NOT COVER A SCREENING COLONOSCOPY FOR PEOPLE UNDER THE AGE OF 50 ° °IF YOU HAVE BCBS INSURANCE, YOU MAY HAVE BENEFITS FOR A SCREENING COLONOSCOPY BUT IF POLYPS ARE FOUND THE DIAGNOSIS WILL CHANGE AND THEN YOU MAY HAVE A DEDUCTIBLE THAT WILL NEED TO BE MET. SO PLEASE MAKE SURE YOU CHECK YOUR BENEFITS FOR A SCREENING COLONOSCOPY AS WELL AS A  DIAGNOSTIC COLONOSCOPY. ° ° ° ° ° °

## 2020-04-11 ENCOUNTER — Other Ambulatory Visit: Payer: Self-pay | Admitting: *Deleted

## 2020-04-11 NOTE — Progress Notes (Signed)
Ok to schedule.  ASA II 

## 2020-05-11 ENCOUNTER — Other Ambulatory Visit: Payer: Self-pay

## 2020-05-11 ENCOUNTER — Other Ambulatory Visit: Payer: 59

## 2020-05-11 DIAGNOSIS — Z20822 Contact with and (suspected) exposure to covid-19: Secondary | ICD-10-CM

## 2020-05-14 LAB — NOVEL CORONAVIRUS, NAA: SARS-CoV-2, NAA: NOT DETECTED

## 2020-05-15 ENCOUNTER — Other Ambulatory Visit (HOSPITAL_COMMUNITY)
Admission: RE | Admit: 2020-05-15 | Discharge: 2020-05-15 | Disposition: A | Discharge status: Home / Self Care | Payer: 59 | Source: Ambulatory Visit | Attending: Internal Medicine | Admitting: Internal Medicine

## 2020-05-15 ENCOUNTER — Other Ambulatory Visit: Payer: Self-pay

## 2020-05-15 DIAGNOSIS — Z20822 Contact with and (suspected) exposure to covid-19: Secondary | ICD-10-CM | POA: Insufficient documentation

## 2020-05-15 DIAGNOSIS — Z01812 Encounter for preprocedural laboratory examination: Secondary | ICD-10-CM | POA: Diagnosis present

## 2020-05-16 LAB — SARS CORONAVIRUS 2 (TAT 6-24 HRS): SARS Coronavirus 2: NEGATIVE

## 2020-05-17 ENCOUNTER — Other Ambulatory Visit: Payer: Self-pay

## 2020-05-17 ENCOUNTER — Ambulatory Visit (HOSPITAL_COMMUNITY)
Admission: RE | Admit: 2020-05-17 | Discharge: 2020-05-17 | Disposition: A | Discharge status: Home / Self Care | Payer: 59 | Attending: Internal Medicine | Admitting: Internal Medicine

## 2020-05-17 ENCOUNTER — Ambulatory Visit (HOSPITAL_COMMUNITY): Payer: 59 | Admitting: Anesthesiology

## 2020-05-17 ENCOUNTER — Encounter (HOSPITAL_COMMUNITY): Payer: Self-pay

## 2020-05-17 ENCOUNTER — Encounter (HOSPITAL_COMMUNITY)
Admission: RE | Disposition: A | Discharge status: Home / Self Care | Payer: Self-pay | Source: Home / Self Care | Attending: Internal Medicine

## 2020-05-17 DIAGNOSIS — Z1211 Encounter for screening for malignant neoplasm of colon: Secondary | ICD-10-CM | POA: Diagnosis not present

## 2020-05-17 DIAGNOSIS — Z800 Family history of malignant neoplasm of digestive organs: Secondary | ICD-10-CM | POA: Insufficient documentation

## 2020-05-17 DIAGNOSIS — Z888 Allergy status to other drugs, medicaments and biological substances status: Secondary | ICD-10-CM | POA: Insufficient documentation

## 2020-05-17 DIAGNOSIS — K648 Other hemorrhoids: Secondary | ICD-10-CM | POA: Diagnosis not present

## 2020-05-17 DIAGNOSIS — Z79899 Other long term (current) drug therapy: Secondary | ICD-10-CM | POA: Insufficient documentation

## 2020-05-17 HISTORY — PX: COLONOSCOPY WITH PROPOFOL: SHX5780

## 2020-05-17 SURGERY — COLONOSCOPY WITH PROPOFOL
Anesthesia: General

## 2020-05-17 MED ORDER — LACTATED RINGERS IV SOLN: INTRAVENOUS | Status: DC | PRN

## 2020-05-17 MED ORDER — LACTATED RINGERS IV SOLN
Freq: Once | INTRAVENOUS | Status: AC
  Administered 2020-05-17: 1000 mL via INTRAVENOUS

## 2020-05-17 MED ORDER — LIDOCAINE HCL (CARDIAC) PF 100 MG/5ML IV SOSY
PREFILLED_SYRINGE | INTRAVENOUS | Status: DC | PRN
  Administered 2020-05-17: 50 mg via INTRAVENOUS

## 2020-05-17 MED ORDER — PROPOFOL 10 MG/ML IV BOLUS
INTRAVENOUS | Status: DC | PRN
  Administered 2020-05-17: 150 ug/kg/min via INTRAVENOUS
  Administered 2020-05-17: 100 mg via INTRAVENOUS

## 2020-05-17 NOTE — Transfer of Care (Signed)
Immediate Anesthesia Transfer of Care Note  Patient: Joshua Forbes  Procedure(s) Performed: COLONOSCOPY WITH PROPOFOL (N/A )  Patient Location: Endoscopy Unit  Anesthesia Type:General  Level of Consciousness: awake, alert , oriented and patient cooperative  Airway & Oxygen Therapy: Patient Spontanous Breathing  Post-op Assessment: Report given to RN, Post -op Vital signs reviewed and stable and Patient moving all extremities  Post vital signs: Reviewed and stable  Last Vitals:  Vitals Value Taken Time  BP    Temp    Pulse    Resp    SpO2      Last Pain:  Vitals:   05/17/20 1200  TempSrc:   PainSc: 0-No pain      Patients Stated Pain Goal: 4 (16/10/96 0454)  Complications: No complications documented.

## 2020-05-17 NOTE — Anesthesia Preprocedure Evaluation (Addendum)
Anesthesia Evaluation  Patient identified by MRN, date of birth, ID band Patient awake    Reviewed: Allergy & Precautions, NPO status , Patient's Chart, lab work & pertinent test results  History of Anesthesia Complications Negative for: history of anesthetic complications  Airway Mallampati: II  TM Distance: >3 FB Neck ROM: Full    Dental  (+) Dental Advisory Given, Teeth Intact Crown :   Pulmonary sleep apnea ,    Pulmonary exam normal breath sounds clear to auscultation       Cardiovascular Exercise Tolerance: Good hypertension, Pt. on medications Normal cardiovascular exam Rhythm:Regular Rate:Normal     Neuro/Psych PSYCHIATRIC DISORDERS Depression    GI/Hepatic negative GI ROS, Neg liver ROS,   Endo/Other  negative endocrine ROS  Renal/GU negative Renal ROS     Musculoskeletal negative musculoskeletal ROS (+)   Abdominal   Peds  Hematology negative hematology ROS (+)   Anesthesia Other Findings   Reproductive/Obstetrics negative OB ROS                            Anesthesia Physical Anesthesia Plan  ASA: II  Anesthesia Plan: General   Post-op Pain Management:    Induction: Intravenous  PONV Risk Score and Plan: TIVA  Airway Management Planned: Nasal Cannula and Natural Airway  Additional Equipment:   Intra-op Plan:   Post-operative Plan:   Informed Consent: I have reviewed the patients History and Physical, chart, labs and discussed the procedure including the risks, benefits and alternatives for the proposed anesthesia with the patient or authorized representative who has indicated his/her understanding and acceptance.     Dental advisory given  Plan Discussed with: CRNA and Surgeon  Anesthesia Plan Comments:        Anesthesia Quick Evaluation

## 2020-05-17 NOTE — H&P (Signed)
Primary Care Physician:  Chesley Noon, MD Primary Gastroenterologist:  Dr. Abbey Chatters  Pre-Procedure History & Physical: HPI:  Joshua Forbes is a 65 y.o. male is here for a colonoscopy for colon cancer screening purposes.  Notes his father had colon cancer at age 53. No melena or hematochezia.  No abdominal pain or unintentional weight loss.  No change in bowel habits.  Overall feels well from a GI standpoint.  Past Medical History:  Diagnosis Date  . Hypertension   . Prediabetes   . Sleep apnea     Past Surgical History:  Procedure Laterality Date  . HERNIA REPAIR      Prior to Admission medications   Medication Sig Start Date End Date Taking? Authorizing Provider  losartan (COZAAR) 50 MG tablet Take 50 mg by mouth daily.   Yes [provider]  tamsulosin (FLOMAX) 0.4 MG CAPS capsule Take 0.4 mg by mouth every other day.   Yes [provider]  Turmeric 500 MG TABS Take 1,000 mg by mouth 2 (two) times daily.   Yes [provider]    Allergies as of 04/11/2020 - Review Complete 04/09/2020  Allergen Reaction Noted  . Lisinopril Swelling 07/23/2009    Family History  Problem Relation Age of Onset  . Colon cancer Father   . Diabetes Brother   . Heart disease Neg Hx     Social History   Socioeconomic History  . Marital status: Single    Spouse name: Not on file  . Number of children: Not on file  . Years of education: Not on file  . Highest education level: Not on file  Occupational History  . Not on file  Tobacco Use  . Smoking status: Never Smoker  . Smokeless tobacco: Never Used  Substance and Sexual Activity  . Alcohol use: No  . Drug use: No  . Sexual activity: Not on file  Other Topics Concern  . Not on file  Social History Narrative  . Not on file   Social Determinants of Health   Financial Resource Strain: Not on file  Food Insecurity: Not on file  Transportation Needs: Not on file  Physical Activity: Not on file   Stress: Not on file  Social Connections: Not on file  Intimate Partner Violence: Not on file    Review of Systems: See HPI, otherwise negative ROS  Impression/Plan: [name] is here for a colonoscopy to be performed for colon cancer screening purposes.   The risks of the procedure including infection, bleed, or perforation as well as benefits, limitations, alternatives and imponderables have been reviewed with the patient. Questions have been answered. All parties agreeable.

## 2020-05-17 NOTE — Anesthesia Postprocedure Evaluation (Signed)
Anesthesia Post Note  Patient: Joshua Forbes  Procedure(s) Performed: COLONOSCOPY WITH PROPOFOL (N/A )  Patient location during evaluation: Endoscopy Anesthesia Type: General Level of consciousness: awake, oriented, awake and alert and patient cooperative Pain management: pain level controlled Vital Signs Assessment: post-procedure vital signs reviewed and stable Respiratory status: spontaneous breathing, respiratory function stable and nonlabored ventilation Cardiovascular status: blood pressure returned to baseline and stable Postop Assessment: no headache and no backache Anesthetic complications: no   No complications documented.   Last Vitals:  Vitals:   05/17/20 1122  BP: (!) 142/107  Pulse: 65  Resp: 20  Temp: 36.7 C  SpO2: 98%    Last Pain:  Vitals:   05/17/20 1200  TempSrc:   PainSc: 0-No pain                 Tacy Learn

## 2020-05-17 NOTE — Discharge Instructions (Addendum)
Colonoscopy Discharge Instructions  Read the instructions outlined below and refer to this sheet in the next few weeks. These discharge instructions provide you with general information on caring for yourself after you leave the hospital. Your doctor may also give you specific instructions. While your treatment has been planned according to the most current medical practices available, unavoidable complications occasionally occur.   ACTIVITY  You may resume your regular activity, but move at a slower pace for the next 24 hours.   Take frequent rest periods for the next 24 hours.   Walking will help get rid of the air and reduce the bloated feeling in your belly (abdomen).   No driving for 24 hours (because of the medicine (anesthesia) used during the test).    Do not sign any important legal documents or operate any machinery for 24 hours (because of the anesthesia used during the test).  NUTRITION  Drink plenty of fluids.   You may resume your normal diet as instructed by your doctor.   Begin with a light meal and progress to your normal diet. Heavy or fried foods are harder to digest and may make you feel sick to your stomach (nauseated).   Avoid alcoholic beverages for 24 hours or as instructed.  MEDICATIONS  You may resume your normal medications unless your doctor tells you otherwise.  WHAT YOU CAN EXPECT TODAY  Some feelings of bloating in the abdomen.   Passage of more gas than usual.   Spotting of blood in your stool or on the toilet paper.  IF YOU HAD POLYPS REMOVED DURING THE COLONOSCOPY:  No aspirin products for 7 days or as instructed.   No alcohol for 7 days or as instructed.   Eat a soft diet for the next 24 hours.  FINDING OUT THE RESULTS OF YOUR TEST Not all test results are available during your visit. If your test results are not back during the visit, make an appointment with your caregiver to find out the results. Do not assume everything is normal if  you have not heard from your caregiver or the medical facility. It is important for you to follow up on all of your test results.  SEEK IMMEDIATE MEDICAL ATTENTION IF:  You have more than a spotting of blood in your stool.   Your belly is swollen (abdominal distention).   You are nauseated or vomiting.   You have a temperature over 101.   You have abdominal pain or discomfort that is severe or gets worse throughout the day.   Your colonoscopy was relatively unremarkable.  I did not find any colon polyps or evidence of colon cancer.  Recommend repeating for screening purposes in 10 years.  Follow-up with GI as needed.  I hope you have a great rest of your week!  Elon Alas. Abbey Chatters, D.O. Gastroenterology and Hepatology [facility]   Hemorrhoids Hemorrhoids are swollen veins that may develop:  In the butt (rectum). These are called internal hemorrhoids.  Around the opening of the butt (anus). These are called external hemorrhoids. Hemorrhoids can cause pain, itching, or bleeding. Most of the time, they do not cause serious problems. They usually get better with diet changes, lifestyle changes, and other home treatments. What are the causes? This condition may be caused by:  Having trouble pooping (constipation).  Pushing hard (straining) to poop.  Watery poop (diarrhea).  Pregnancy.  Being very overweight (obese).  Sitting for long periods of time.  Heavy lifting or other activity  that causes you to strain.  Anal sex.  Riding a bike for a long period of time. What are the signs or symptoms? Symptoms of this condition include:  Pain.  Itching or soreness in the butt.  Bleeding from the butt.  Leaking poop.  Swelling in the area.  One or more lumps around the opening of your butt. How is this diagnosed? A doctor can often diagnose this condition by looking at the affected area. The doctor may also:  Do an exam that involves  feeling the area with a gloved hand (digital rectal exam).  Examine the area inside your butt using a small tube (anoscope).  Order blood tests. This may be done if you have lost a lot of blood.  Have you get a test that involves looking inside the colon using a flexible tube with a camera on the end (sigmoidoscopy or colonoscopy). How is this treated? This condition can usually be treated at home. Your doctor may tell you to change what you eat, make lifestyle changes, or try home treatments. If these do not help, procedures can be done to remove the hemorrhoids or make them smaller. These may involve:  Placing rubber bands at the base of the hemorrhoids to cut off their blood supply.  Injecting medicine into the hemorrhoids to shrink them.  Shining a type of light energy onto the hemorrhoids to cause them to fall off.  Doing surgery to remove the hemorrhoids or cut off their blood supply. Follow these instructions at home: Eating and drinking  Eat foods that have a lot of fiber in them. These include whole grains, beans, nuts, fruits, and vegetables.  Ask your doctor about taking products that have added fiber (fibersupplements).  Reduce the amount of fat in your diet. You can do this by: ? Eating low-fat dairy products. ? Eating less red meat. ? Avoiding processed foods.  Drink enough fluid to keep your pee (urine) pale yellow.   Managing pain and swelling  Take a warm-water bath (sitz bath) for 20 minutes to ease pain. Do this 3-4 times a day. You may do this in a bathtub or using a portable sitz bath that fits over the toilet.  If told, put ice on the painful area. It may be helpful to use ice between your warm baths. ? Put ice in a plastic bag. ? Place a towel between your skin and the bag. ? Leave the ice on for 20 minutes, 2-3 times a day.   General instructions  Take over-the-counter and prescription medicines only as told by your doctor. ? Medicated creams and  medicines may be used as told.  Exercise often. Ask your doctor how much and what kind of exercise is best for you.  Go to the bathroom when you have the urge to poop. Do not wait.  Avoid pushing too hard when you poop.  Keep your butt dry and clean. Use wet toilet paper or moist towelettes after pooping.  Do not sit on the toilet for a long time.  Keep all follow-up visits as told by your doctor. This is important. Contact a doctor if you:  Have pain and swelling that do not get better with treatment or medicine.  Have trouble pooping.  Cannot poop.  Have pain or swelling outside the area of the hemorrhoids. Get help right away if you have:  Bleeding that will not stop. Summary  Hemorrhoids are swollen veins in the butt or around the opening of the butt.  They can cause pain, itching, or bleeding.  Eat foods that have a lot of fiber in them. These include whole grains, beans, nuts, fruits, and vegetables.  Take a warm-water bath (sitz bath) for 20 minutes to ease pain. Do this 3-4 times a day. This information is not intended to replace advice given to you by your health care provider. Make sure you discuss any questions you have with your health care provider. Document Revised: 04/28/2018 Document Reviewed: 09/09/2017 Elsevier Patient Education  [place].

## 2020-05-17 NOTE — Op Note (Signed)
[facility] Patient Name: Joshua Forbes Procedure Date: 05/17/2020 11:55 AM MRN: [id number] Date of Birth: 1956/01/05 Attending MD: Elon Alas. Abbey Chatters DO CSN: [id number] Age: 65 Admit Type: Outpatient Procedure:                Colonoscopy Indications:              Screening for colorectal malignant neoplasm Providers:                Elon Alas. Emmaclaire Switala, DO, [place]is [place] B. Sharon Seller, RN,                            Nelma Rothman, Technician Referring MD:              Medicines:                See the Anesthesia note for documentation of the                            administered medications Complications:            No immediate complications. Estimated Blood Loss:     Estimated blood loss: none. Procedure:                Pre-Anesthesia Assessment:                           - The anesthesia plan was to use monitored                            anesthesia care (MAC).                           After obtaining informed consent, the colonoscope                            was passed under direct vision. Throughout the                            procedure, the patient's blood pressure, pulse, and                            oxygen saturations were monitored continuously. The                            [id number]-H190DL ([id number]) scope was introduced through                            the anus and advanced to the the cecum, identified                            by appendiceal orifice and ileocecal valve. The                            colonoscopy was performed without difficulty. The                            patient tolerated the procedure well.  The quality                            of the bowel preparation was evaluated using the                            BBPS [facility] Bowel Preparation Scale) with scores                            of: Right Colon = 2 (minor amount of residual                            staining, small fragments of stool and/or opaque                            liquid, but mucosa seen  well), Transverse Colon = 3                            (entire mucosa seen well with no residual staining,                            small fragments of stool or opaque liquid) and Left                            Colon = 3 (entire mucosa seen well with no residual                            staining, small fragments of stool or opaque                            liquid). The total BBPS score equals 8. The quality                            of the bowel preparation was good. Scope In: 12:05:16 PM Scope Out: 57:84:69 PM Scope Withdrawal Time: 0 hours 8 minutes 55 seconds  Total Procedure Duration: 0 hours 12 minutes 26 seconds  Findings:      The perianal and digital rectal examinations were normal.      Non-bleeding internal hemorrhoids were found during endoscopy.      The exam was otherwise without abnormality. Impression:               - Non-bleeding internal hemorrhoids.                           - The examination was otherwise normal.                           - No specimens collected. Moderate Sedation:      Per Anesthesia Care Recommendation:           - Patient has a contact number available for                            emergencies. The signs and symptoms of potential  delayed complications were discussed with the                            patient. Return to normal activities tomorrow.                            Written discharge instructions were provided to the                            patient.                           - Resume previous diet.                           - Continue present medications.                           - Repeat colonoscopy in 10 years for screening                            purposes.                           - Return to GI clinic PRN. Procedure Code(s):        --- Professional ---                           S2831, Colorectal cancer screening; colonoscopy on                            individual not meeting criteria for  high risk Diagnosis Code(s):        --- Professional ---                           Z12.11, Encounter for screening for malignant                            neoplasm of colon                           K64.8, Other hemorrhoids CPT copyright 2019 American Medical Association. All rights reserved. The codes documented in this report are preliminary and upon coder review may  be revised to meet current compliance requirements. Elon Alas. Abbey Chatters, DO [place] Abbey Chatters, DO 05/17/2020 12:25:54 PM This report has been signed electronically. Number of Addenda: 0

## 2020-05-23 ENCOUNTER — Encounter (HOSPITAL_COMMUNITY): Payer: Self-pay | Admitting: Internal Medicine

## 2021-08-07 ENCOUNTER — Other Ambulatory Visit: Payer: Self-pay | Admitting: Internal Medicine

## 2021-08-07 DIAGNOSIS — R749 Abnormal serum enzyme level, unspecified: Secondary | ICD-10-CM

## 2021-08-08 ENCOUNTER — Ambulatory Visit
Admission: RE | Admit: 2021-08-08 | Discharge: 2021-08-08 | Disposition: A | Discharge status: Home / Self Care | Payer: 59 | Source: Ambulatory Visit | Attending: Internal Medicine | Admitting: Internal Medicine

## 2021-08-08 ENCOUNTER — Other Ambulatory Visit: Payer: Self-pay

## 2021-08-08 DIAGNOSIS — R749 Abnormal serum enzyme level, unspecified: Secondary | ICD-10-CM | POA: Insufficient documentation

## 2021-08-08 IMAGING — US US ABDOMEN COMPLETE
1 series · 13 of 25 positions shown · non-contrast
Comparison: None.

CLINICAL DATA: Abnormal serum enzyme level

EXAM:
ABDOMEN ULTRASOUND COMPLETE

[Series 1: us abdomen complete · 13 of 109 slices shown]
[im 1/109]
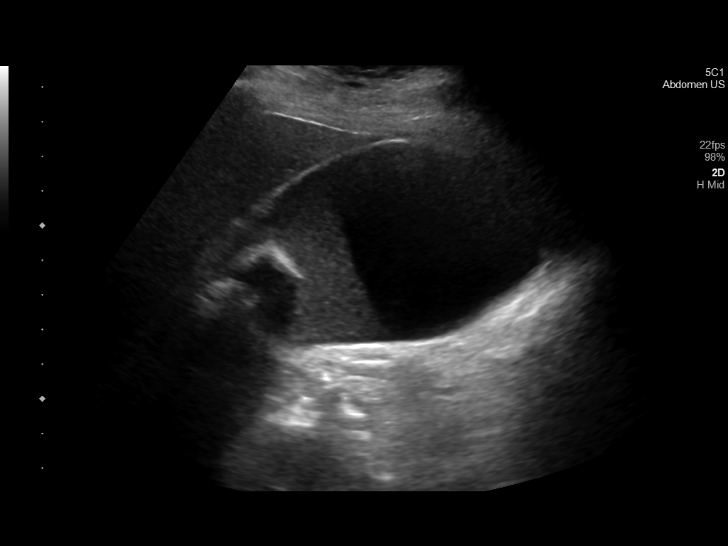
[im 10/109]
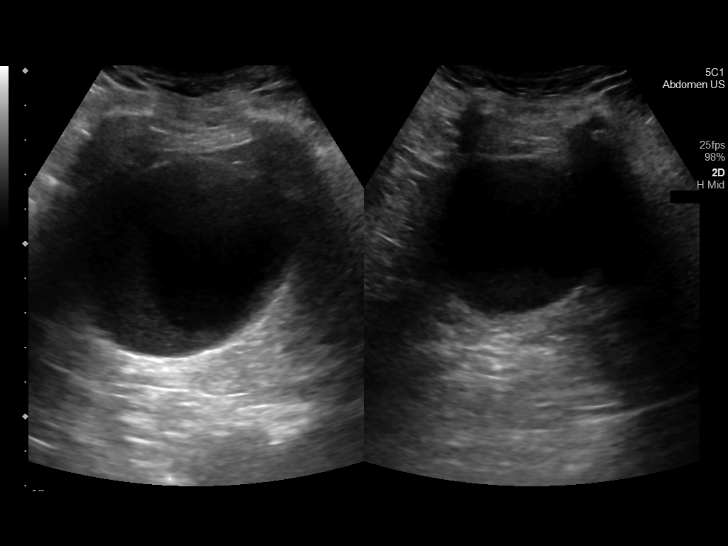
[im 19/109]
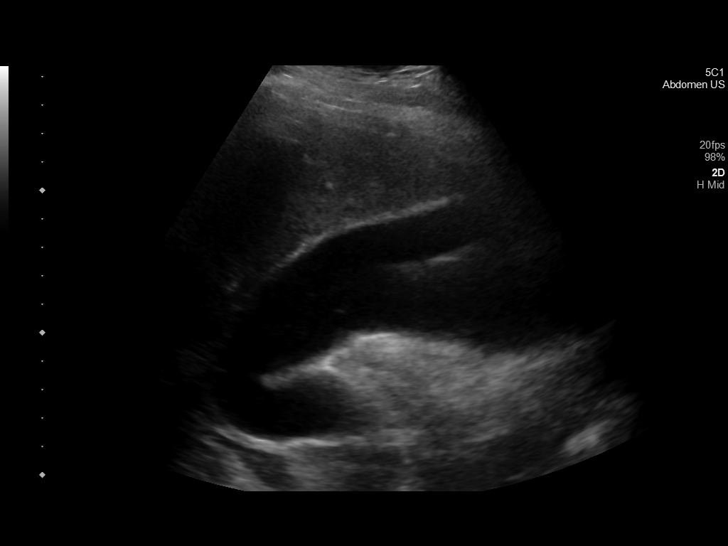
[im 28/109]
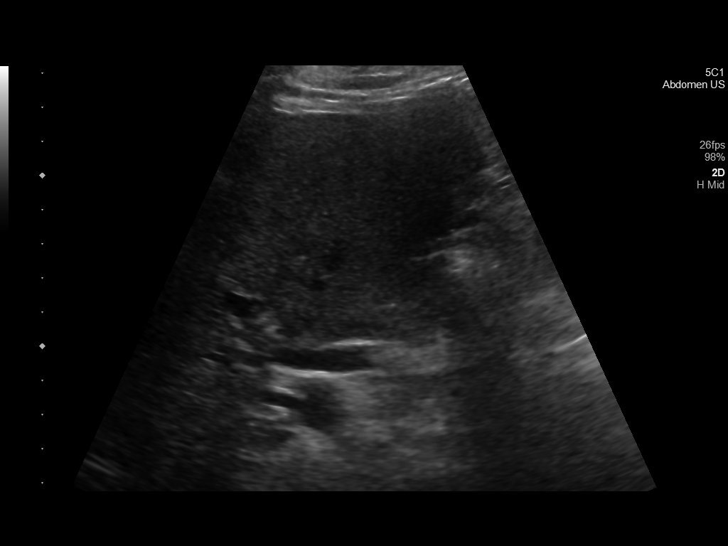
[im 37/109]
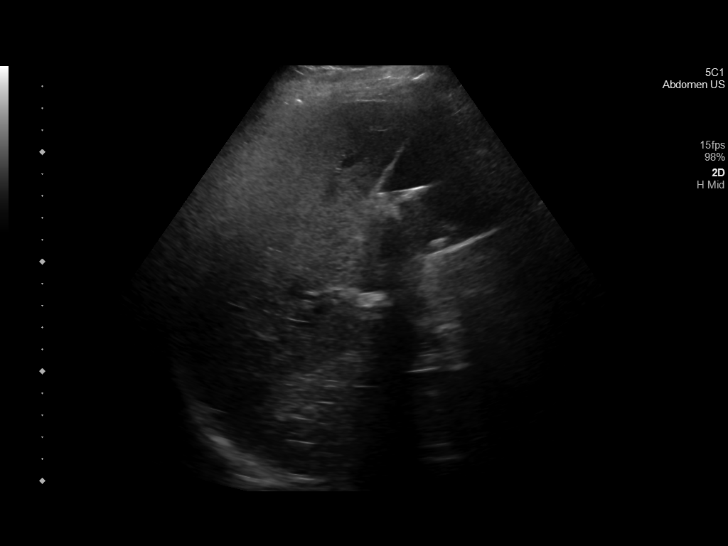
[im 46/109]
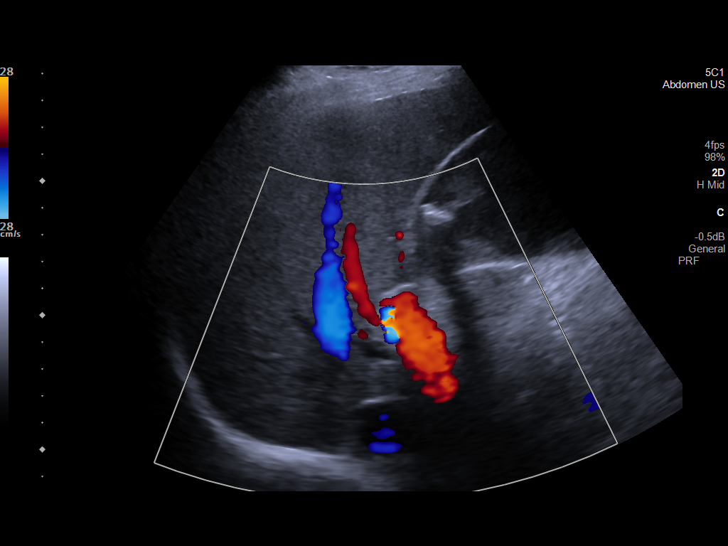
[im 55/109]
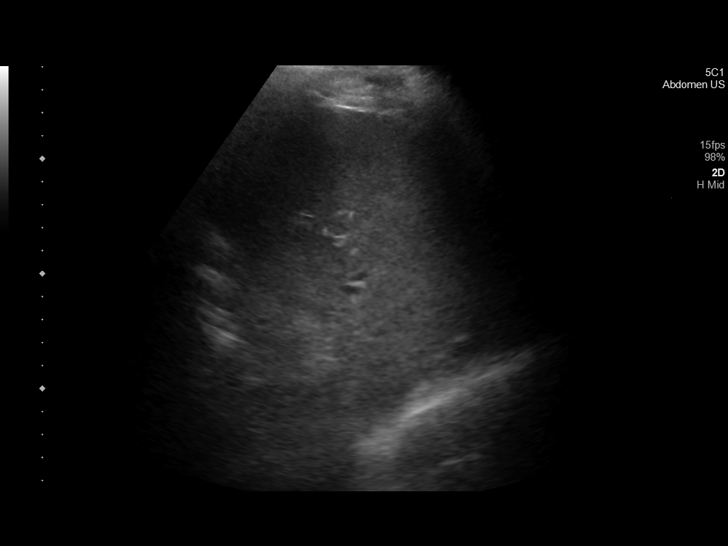
[im 64/109]
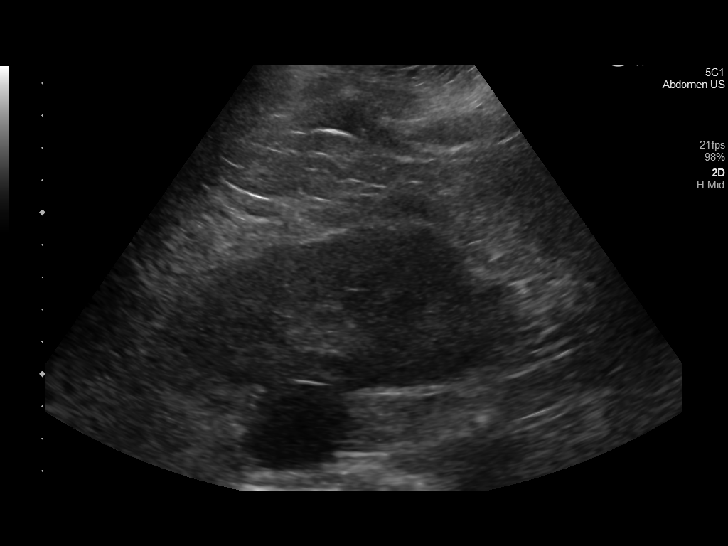
[im 73/109]
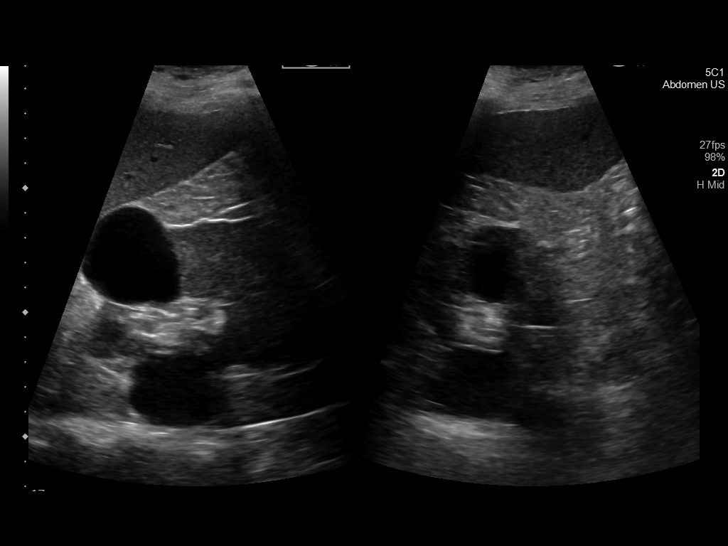
[im 82/109]
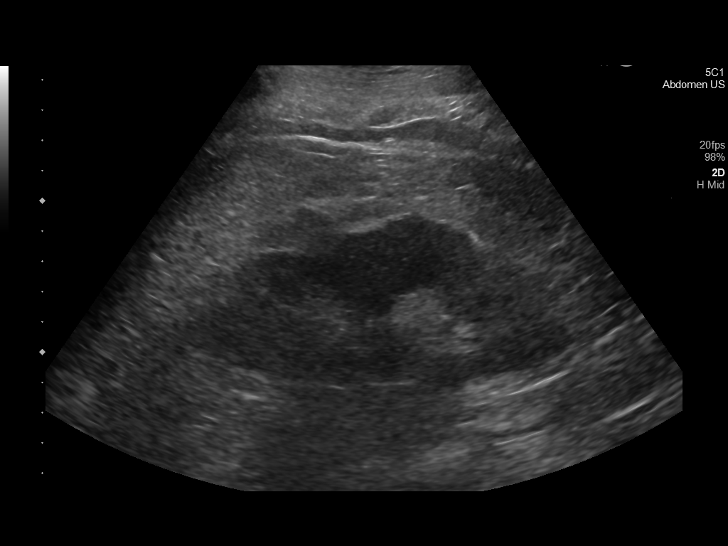
[im 91/109]
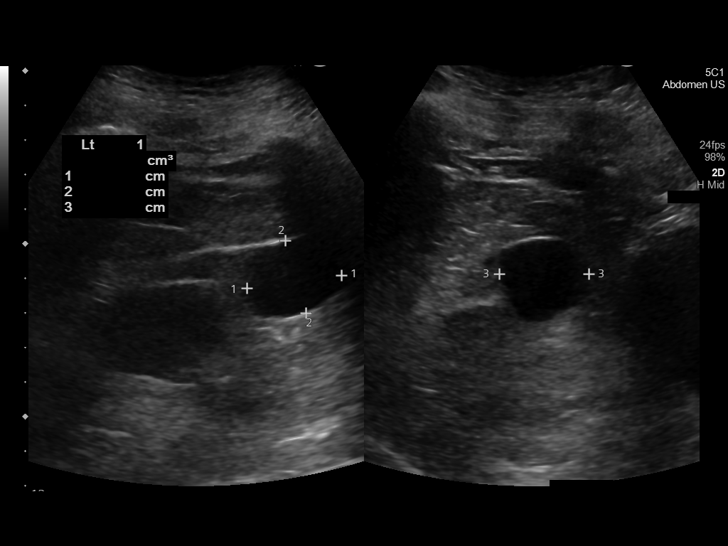
[im 100/109]
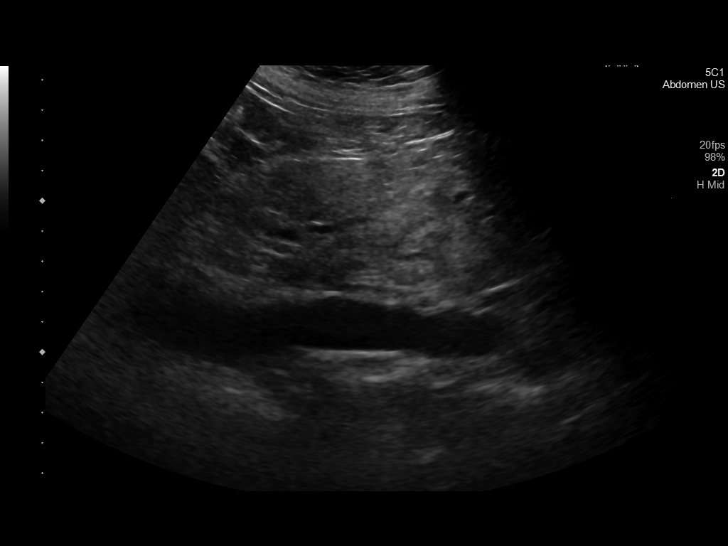
[im 109/109]
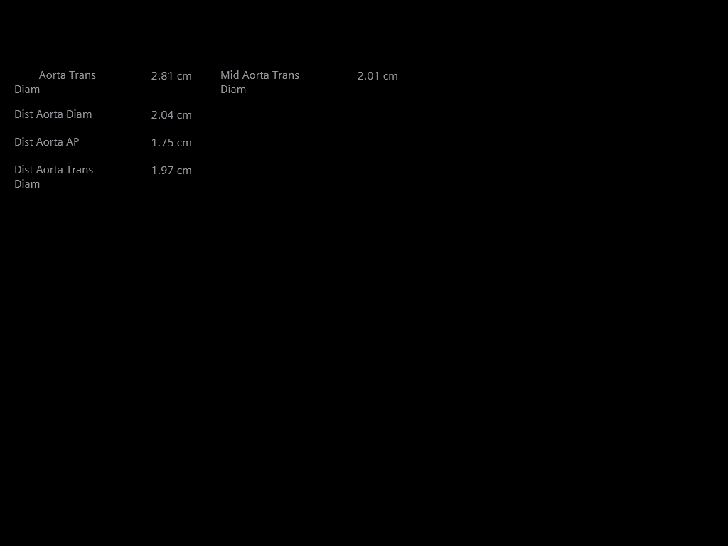

[13 of 25 positions shown; findings below may reference images not displayed]

FINDINGS: Gallbladder: Multiple gallstones and sludge noted within the
gallbladder lumen. No pericholecystic fluid or gallbladder wall
thickening. Sonographic Murphy sign is negative per technologist.

Common bile duct: Diameter: 7 mm

Liver: No focal lesion identified. Within normal limits in
parenchymal echogenicity. Portal vein is patent on color Doppler
imaging with normal direction of blood flow towards the liver.

IVC: No abnormality visualized.

Pancreas: Obscured by shadowing bowel gas.

Spleen: Size and appearance within normal limits.

Right Kidney: Length: 11.6 cm. Echogenicity within normal limits. No
mass or hydronephrosis visualized. Two simple cysts are seen with
the larger measuring 4.2 cm. The cysts do not require further
imaging follow-up.

Left Kidney: Length: 11.6 cm. Echogenicity within normal limits. No
mass or hydronephrosis visualized. Two simple cysts are seen with
the larger measuring 6.3 cm. The cysts do not require further
imaging follow-up.

Abdominal aorta: No aneurysm visualized.

Other findings: None.
IMPRESSION: Moderate cholelithiasis without additional sonographic evidence of
acute cholecystitis.

## 2021-08-21 ENCOUNTER — Other Ambulatory Visit: Payer: Self-pay | Admitting: Nurse Practitioner

## 2021-08-21 ENCOUNTER — Other Ambulatory Visit (HOSPITAL_COMMUNITY): Payer: Self-pay | Admitting: Nurse Practitioner

## 2021-08-21 DIAGNOSIS — R17 Unspecified jaundice: Secondary | ICD-10-CM

## 2021-08-21 DIAGNOSIS — K8020 Calculus of gallbladder without cholecystitis without obstruction: Secondary | ICD-10-CM

## 2021-08-21 DIAGNOSIS — R634 Abnormal weight loss: Secondary | ICD-10-CM

## 2021-08-21 DIAGNOSIS — R7989 Other specified abnormal findings of blood chemistry: Secondary | ICD-10-CM

## 2021-08-21 DIAGNOSIS — R1901 Right upper quadrant abdominal swelling, mass and lump: Secondary | ICD-10-CM

## 2021-08-22 ENCOUNTER — Ambulatory Visit
Admission: RE | Admit: 2021-08-22 | Discharge: 2021-08-22 | Disposition: A | Discharge status: Home / Self Care | Payer: 59 | Source: Ambulatory Visit | Attending: Nurse Practitioner | Admitting: Nurse Practitioner

## 2021-08-22 ENCOUNTER — Other Ambulatory Visit (HOSPITAL_COMMUNITY): Payer: Self-pay | Admitting: Nurse Practitioner

## 2021-08-22 DIAGNOSIS — K8020 Calculus of gallbladder without cholecystitis without obstruction: Secondary | ICD-10-CM

## 2021-08-22 DIAGNOSIS — R634 Abnormal weight loss: Secondary | ICD-10-CM

## 2021-08-22 DIAGNOSIS — R7989 Other specified abnormal findings of blood chemistry: Secondary | ICD-10-CM

## 2021-08-22 DIAGNOSIS — R17 Unspecified jaundice: Secondary | ICD-10-CM

## 2021-08-22 DIAGNOSIS — R1901 Right upper quadrant abdominal swelling, mass and lump: Secondary | ICD-10-CM

## 2021-08-22 IMAGING — MR MR 3D RECON AT SCANNER
2 series · 12 of 16 positions shown · IV contrast (gadavist)
Comparison: Ultrasound on [DATE]

CLINICAL DATA: Abdominal pain for 2 weeks. Elevated liver enzymes.
Cholelithiasis.

EXAM:
MRI ABDOMEN WITHOUT AND WITH CONTRAST (INCLUDING MRCP)
TECHNIQUE: Multiplanar multisequence MR imaging of the abdomen was performed
both before and after the administration of intravenous contrast.
Heavily T2-weighted images of the biliary and pancreatic ducts were
obtained, and three-dimensional MRCP images were rendered by post
processing.
CONTRAST:  10mL GADAVIST GADOBUTROL 1 MMOL/ML IV SOLN

[Series 13: MRCP · coronal · 1.0mm · 0.49mm/px · 10 of 96 slices shown (1 of 2)]
[im 1/96]
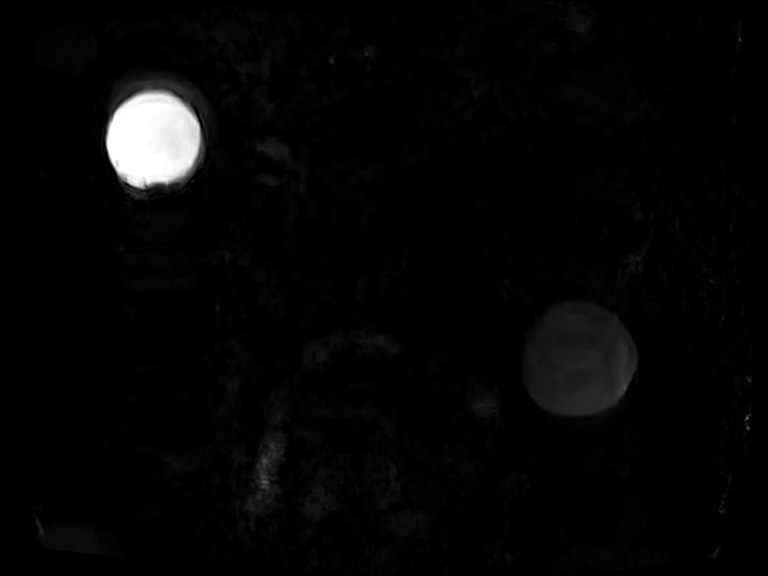
[im 16/96]
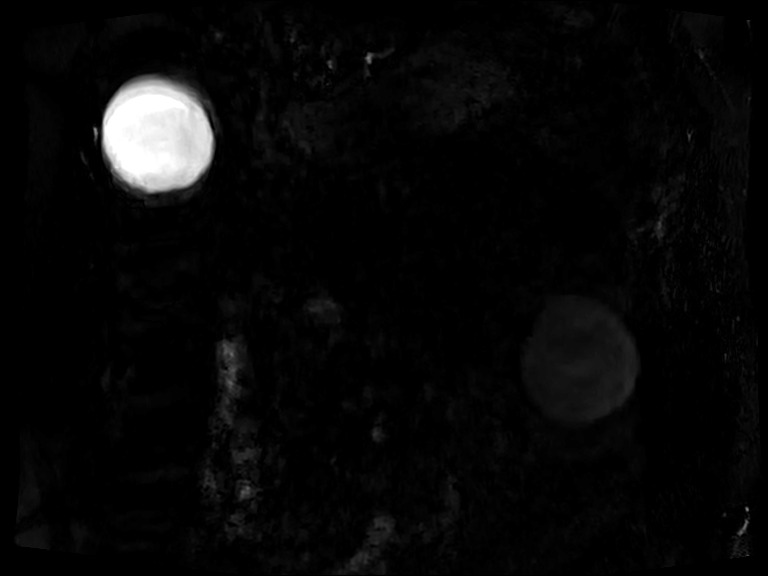
[im 24/96]
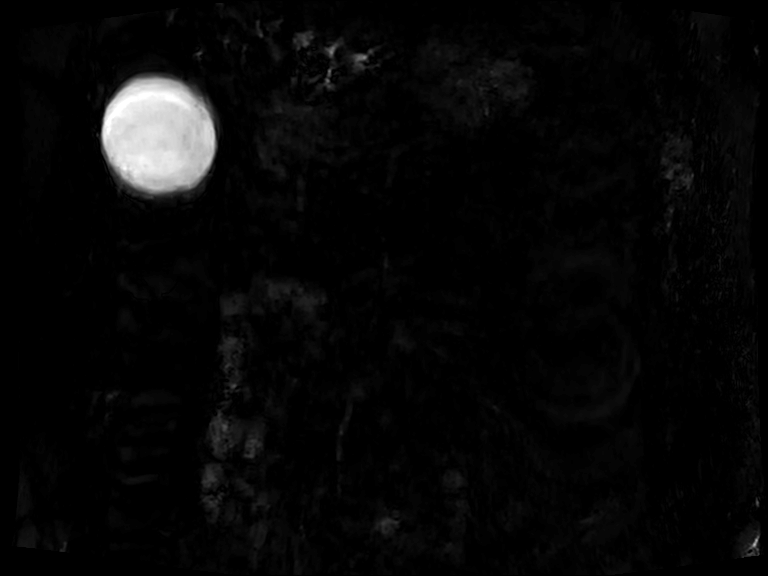
[im 32/96]
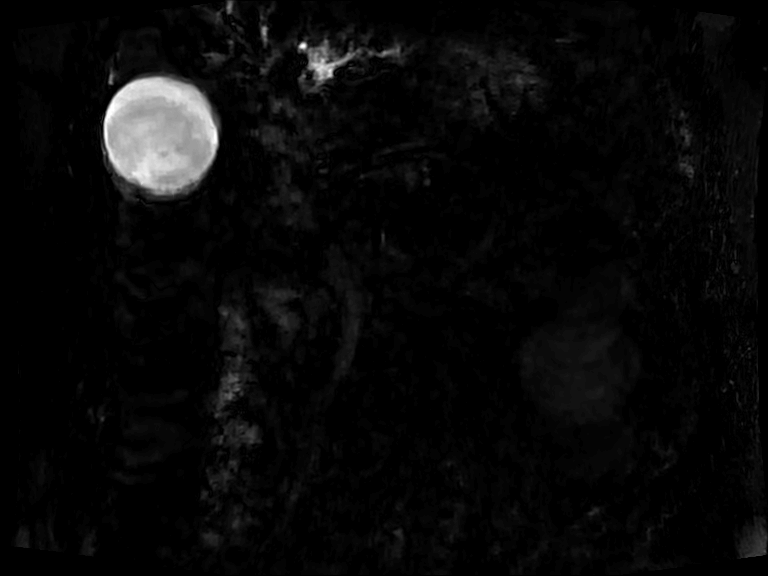
[im 48/96]
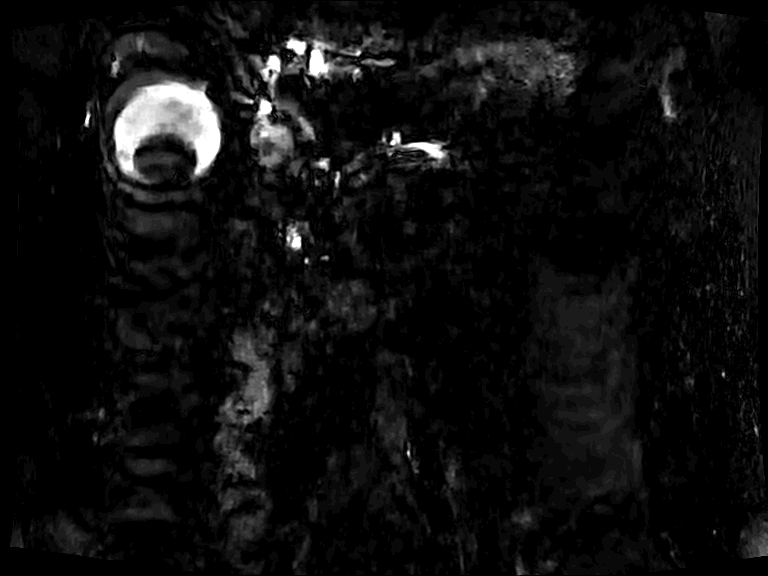
[im 56/96]
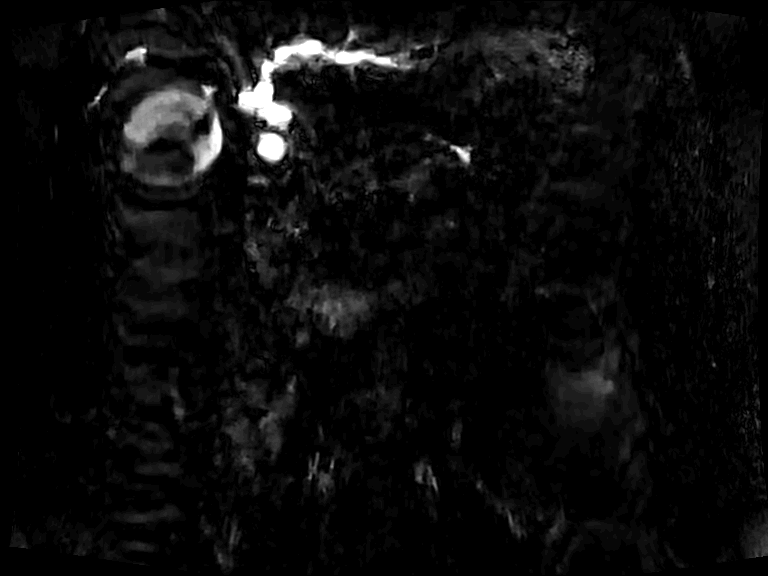
[im 64/96]
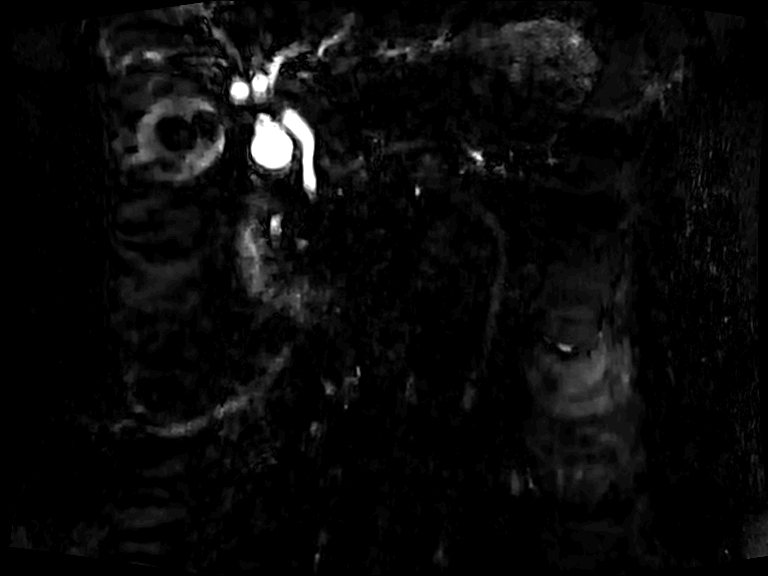
[im 80/96]
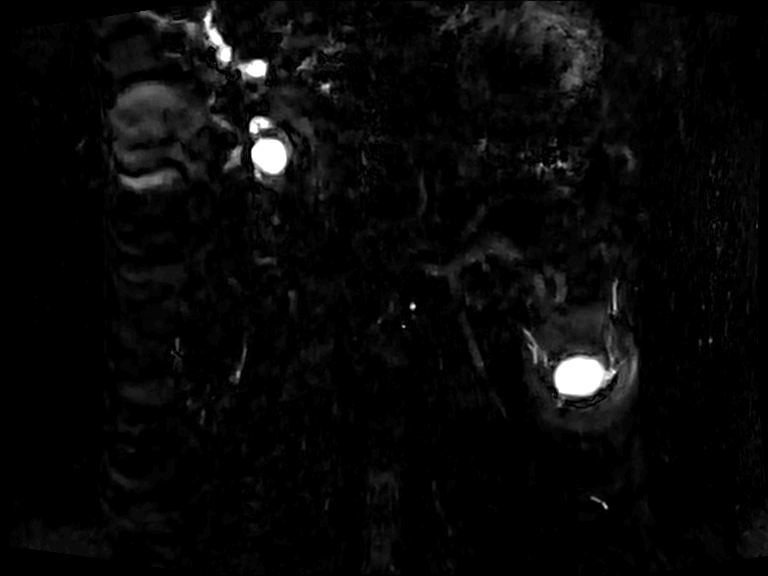
[im 88/96]
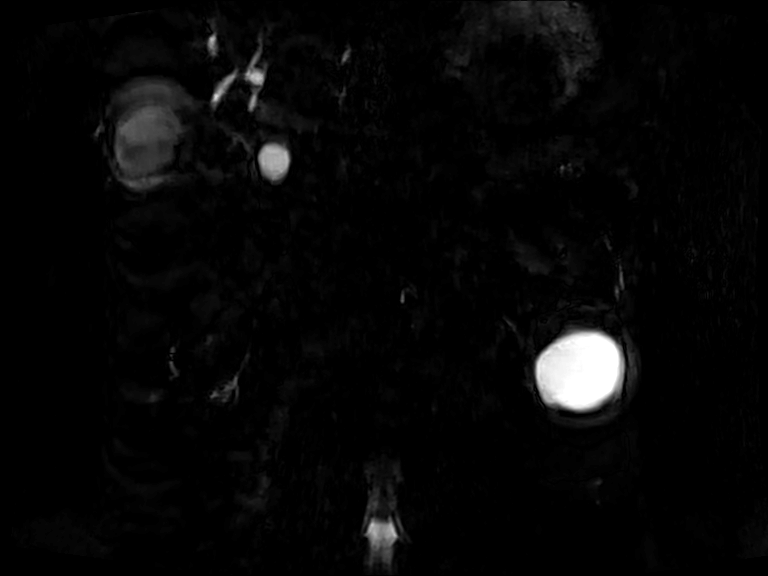
[im 96/96]
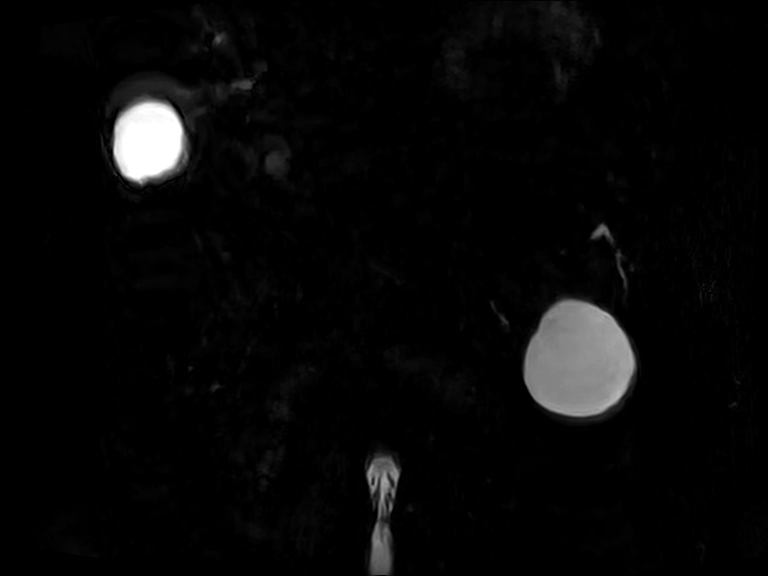

[Series 1030: MRCP · oblique · 483.7mm · 0.49mm/px · 2 of 18 slices shown (2 of 2)]
[im 9/18]
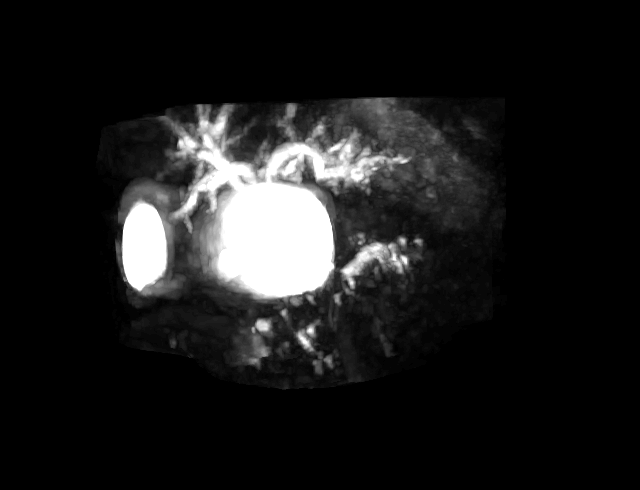
[im 18/18]
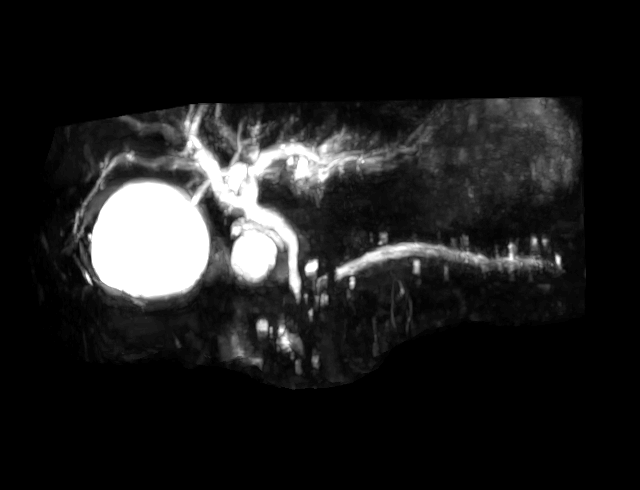

[12 of 16 positions shown; findings below may reference images not displayed]

FINDINGS: Lower chest: No acute findings.

Hepatobiliary: No hepatic masses identified. Gallbladder contains
multiple gallstones measuring up to 3 cm. Gallbladder is distended,
however there is no evidence of gallbladder wall thickening or
pericholecystic inflammatory changes. Mild diffuse biliary ductal
dilatation is seen to the level the pancreatic head. No intraductal
calculi identified.

Pancreas: Mild diffuse pancreatic ductal dilatation with mild
side-branch dilatation seen. A focal poorly defined hypovascular
area with mild T2 hyperintensity is seen in the pancreatic head
which measures 3.0 x 1.7 cm on image 46/22. This is suspicious for
pancreatic carcinoma. No evidence of involvement of the SMA, SMV, or
portal vein.

Spleen:  Within normal limits in size and appearance.

Adrenals/Urinary Tract: No masses identified. Benign-appearing renal
cysts are noted bilaterally (no followup imaging is recommended). No
evidence of hydronephrosis.

Stomach/Bowel: Periampullary duodenal diverticulum is seen adjacent
to the pancreatic head. Otherwise unremarkable.

Vascular/Lymphatic: Several small celiac and porta hepatis lymph
nodes are seen, largest measuring 12 mm on image 34/18. No acute
vascular findings.

Other:  None.

Musculoskeletal:  No suspicious bone lesions identified.
IMPRESSION: Diffuse biliary and pancreatic ductal dilatation, with poorly
defined hypovascular masslike lesion in the pancreatic head highly
suspicious for pancreatic carcinoma. Consider EUS/FNA for further
evaluation.

Mild lymphadenopathy in porta hepatis and celiac axis, suspicious
for metastatic disease.

Cholelithiasis. No radiographic evidence of cholecystitis.

## 2021-08-22 IMAGING — MR MR ABDOMEN WO/W CM MRCP
19 of 21 series · 43 of 48 positions shown · IV contrast (gadavist)
Comparison: Ultrasound on [DATE]

CLINICAL DATA: Abdominal pain for 2 weeks. Elevated liver enzymes.
Cholelithiasis.

EXAM:
MRI ABDOMEN WITHOUT AND WITH CONTRAST (INCLUDING MRCP)
TECHNIQUE: Multiplanar multisequence MR imaging of the abdomen was performed
both before and after the administration of intravenous contrast.
Heavily T2-weighted images of the biliary and pancreatic ducts were
obtained, and three-dimensional MRCP images were rendered by post
processing.
CONTRAST:  10mL GADAVIST GADOBUTROL 1 MMOL/ML IV SOLN

[Series 3: T2 · coronal · 6.0mm · 1.19mm/px · 1 of 39 slices shown (1 of 2)]
[im 1/39]
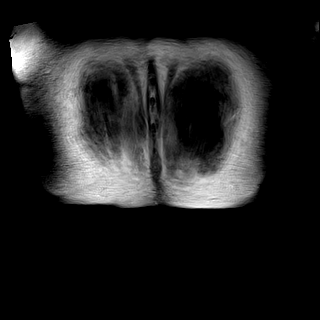

[Series 4: T2 · axial · 6.0mm · 1.19mm/px · 1 of 34 slices shown (2 of 2)]
[im 1/34]
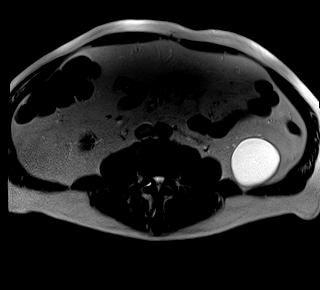

[Series 5: T1 · axial · 3.0mm · 1.19mm/px · z∈[-127,+104]mm · 2 of 78 slices shown (1 of 2)]
[im 1/78]
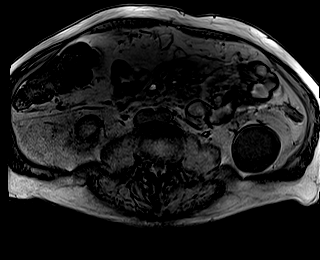
[im 78/78]
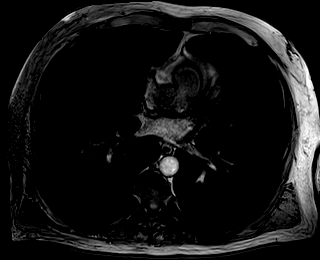

[Series 6: T1 · axial · 3.0mm · 1.19mm/px · z∈[-127,+104]mm · 2 of 78 slices shown (2 of 2)]
[im 1/78]
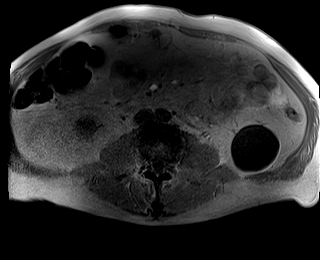
[im 78/78]
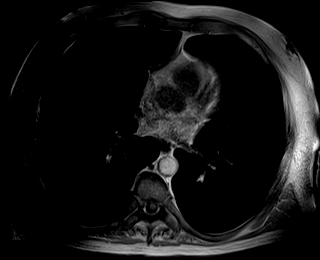

[Series 8: T2 fat-sat · axial · 6.0mm · 1.19mm/px · 1 of 34 slices shown]
[im 1/34]
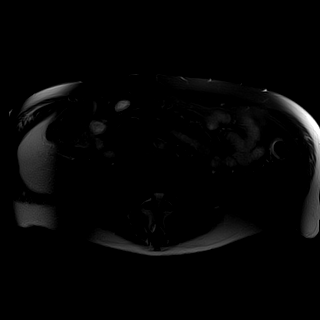

[Series 10: ax dwi_tracew · axial · 6.0mm · 1.42mm/px · z∈[-130,+108]mm · 3 of 102 slices shown]
[im 1/102]
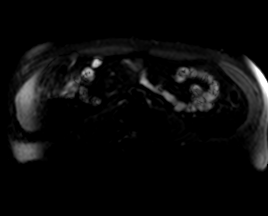
[im 51/102]
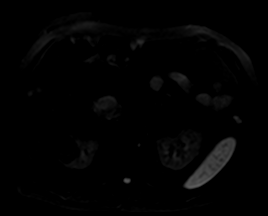
[im 102/102]
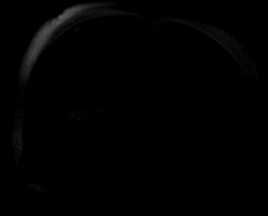

[Series 11: ax dwi_adc · axial · 6.0mm · 1.42mm/px · 1 of 34 slices shown]
[im 1/34]
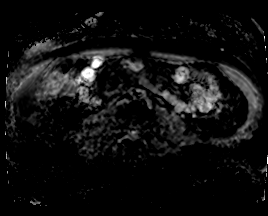

[Series 15: MRCP · coronal · 3.0mm · 1.12mm/px · 1 of 17 slices shown]
[im 1/17]
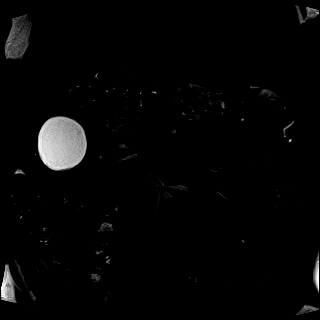

[Series 16: radials · coronal · 50.0mm · 0.78mm/px · 1 of 5 slices shown]
[im 1/5]
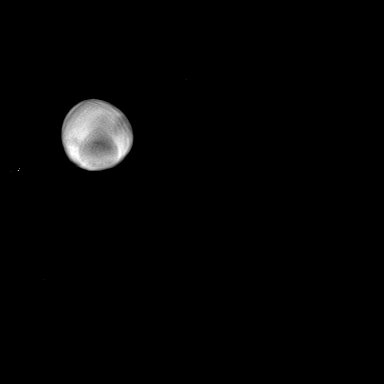

[Series 17: T1 dynamic fat-sat · axial · non-contrast · 3.0mm · 1.19mm/px · z∈[-143,+76]mm · 3 of 74 slices shown (1 of 5)]
[im 1/74]
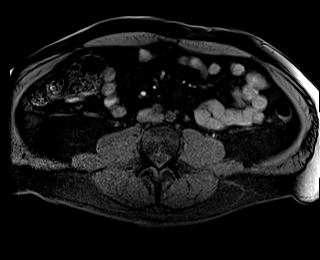
[im 37/74]
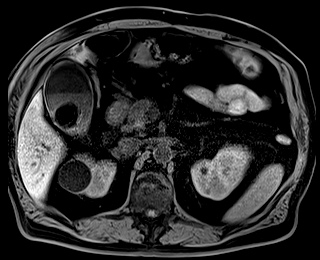
[im 74/74]
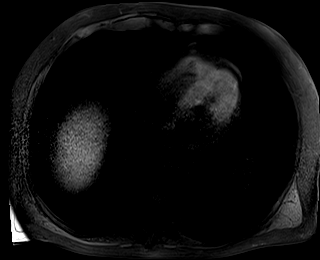

[Series 18: T1 dynamic fat-sat post-contrast · axial · 3.0mm · 1.19mm/px · z∈[-143,+76]mm · 3 of 74 slices shown (1 of 4)]
[im 1/74]
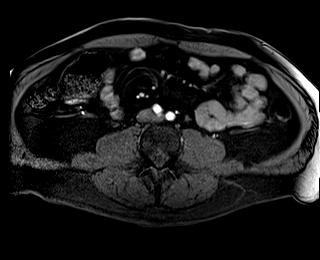
[im 37/74]
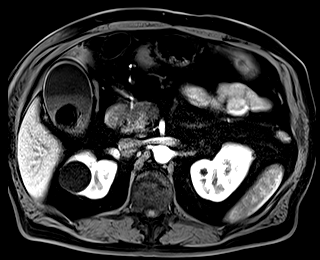
[im 74/74]
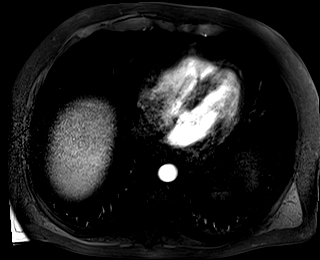

[Series 19: T1 dynamic fat-sat · axial · 3.0mm · 1.19mm/px · z∈[-143,+76]mm · 3 of 74 slices shown (2 of 5)]
[im 1/74]
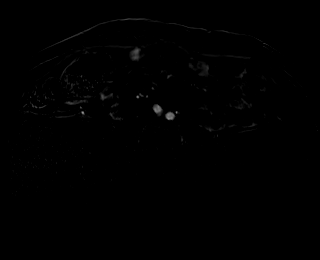
[im 37/74]
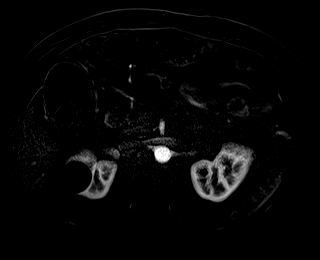
[im 74/74]
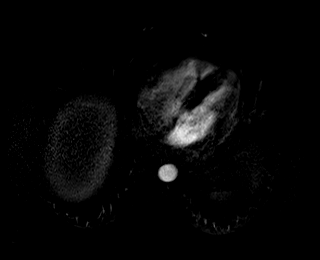

[Series 20: T1 dynamic fat-sat post-contrast · axial · 3.0mm · 1.19mm/px · z∈[-143,+76]mm · 3 of 74 slices shown (2 of 4)]
[im 1/74]
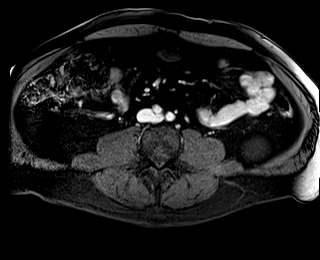
[im 37/74]
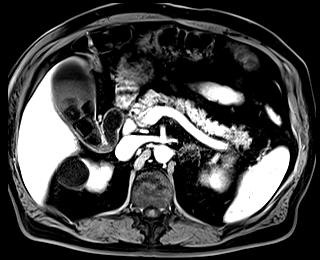
[im 74/74]
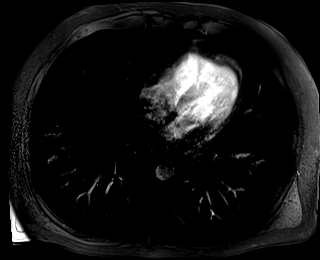

[Series 21: T1 dynamic fat-sat · axial · 3.0mm · 1.19mm/px · z∈[-143,+76]mm · 3 of 74 slices shown (3 of 5)]
[im 1/74]
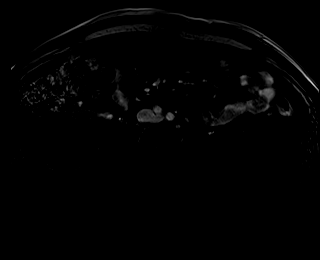
[im 37/74]
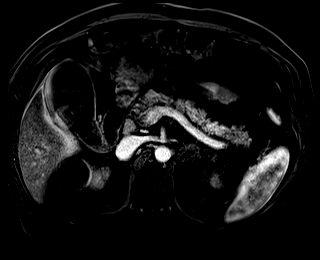
[im 74/74]
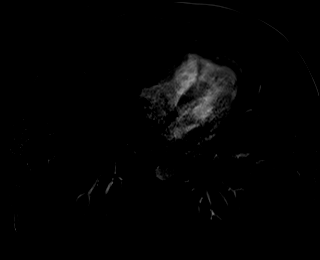

[Series 22: T1 dynamic fat-sat post-contrast · axial · 3.0mm · 1.19mm/px · z∈[-143,+76]mm · 3 of 74 slices shown (3 of 4)]
[im 1/74]
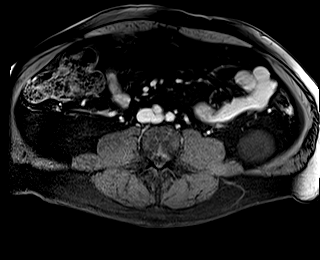
[im 37/74]
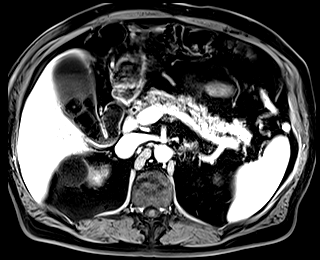
[im 74/74]
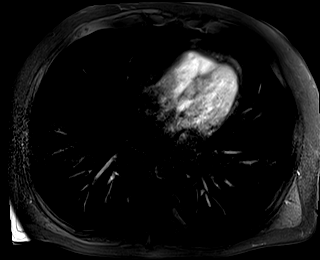

[Series 23: T1 dynamic fat-sat · axial · 3.0mm · 1.19mm/px · z∈[-143,+76]mm · 3 of 74 slices shown (4 of 5)]
[im 1/74]
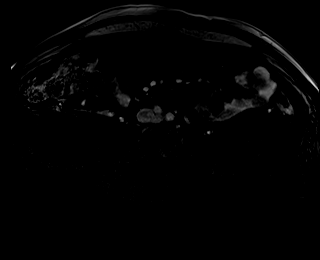
[im 37/74]
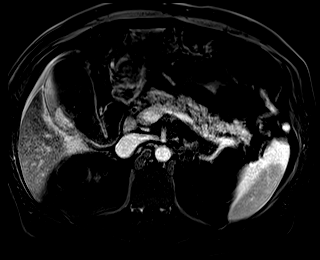
[im 74/74]
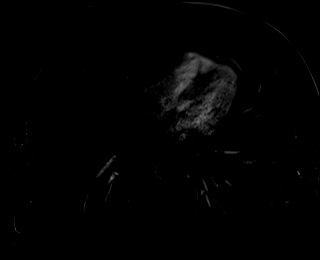

[Series 24: T1 dynamic post-contrast · coronal · 3.0mm · 1.31mm/px · 3 of 82 slices shown]
[im 1/82]
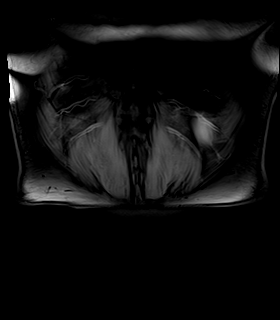
[im 41/82]
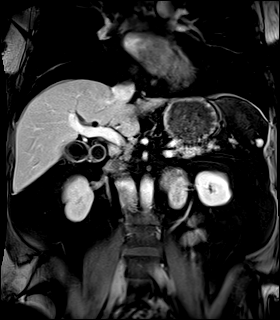
[im 82/82]
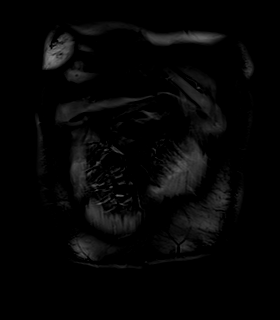

[Series 25: T1 dynamic fat-sat post-contrast · axial · 3.0mm · 1.19mm/px · z∈[-143,+76]mm · 3 of 74 slices shown (4 of 4)]
[im 1/74]
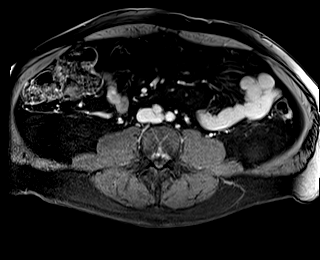
[im 37/74]
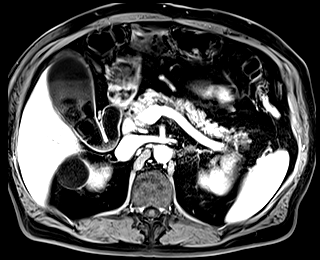
[im 74/74]
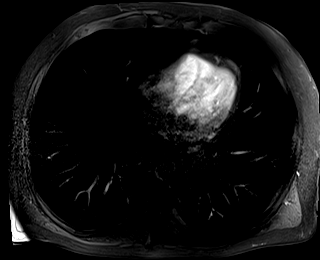

[Series 26: T1 dynamic fat-sat · axial · 3.0mm · 1.19mm/px · z∈[-143,+76]mm · 3 of 74 slices shown (5 of 5)]
[im 1/74]
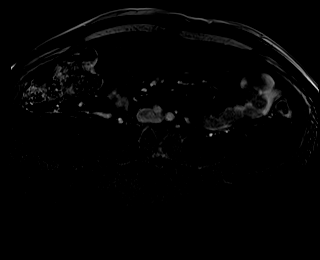
[im 37/74]
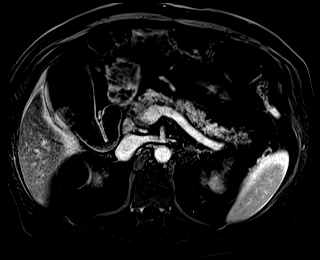
[im 74/74]
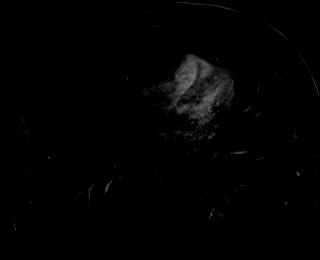

[43 of 48 positions shown; findings below may reference images not displayed]

FINDINGS: Lower chest: No acute findings.

Hepatobiliary: No hepatic masses identified. Gallbladder contains
multiple gallstones measuring up to 3 cm. Gallbladder is distended,
however there is no evidence of gallbladder wall thickening or
pericholecystic inflammatory changes. Mild diffuse biliary ductal
dilatation is seen to the level the pancreatic head. No intraductal
calculi identified.

Pancreas: Mild diffuse pancreatic ductal dilatation with mild
side-branch dilatation seen. A focal poorly defined hypovascular
area with mild T2 hyperintensity is seen in the pancreatic head
which measures 3.0 x 1.7 cm on image 46/22. This is suspicious for
pancreatic carcinoma. No evidence of involvement of the SMA, SMV, or
portal vein.

Spleen:  Within normal limits in size and appearance.

Adrenals/Urinary Tract: No masses identified. Benign-appearing renal
cysts are noted bilaterally (no followup imaging is recommended). No
evidence of hydronephrosis.

Stomach/Bowel: Periampullary duodenal diverticulum is seen adjacent
to the pancreatic head. Otherwise unremarkable.

Vascular/Lymphatic: Several small celiac and porta hepatis lymph
nodes are seen, largest measuring 12 mm on image 34/18. No acute
vascular findings.

Other:  None.

Musculoskeletal:  No suspicious bone lesions identified.
IMPRESSION: Diffuse biliary and pancreatic ductal dilatation, with poorly
defined hypovascular masslike lesion in the pancreatic head highly
suspicious for pancreatic carcinoma. Consider EUS/FNA for further
evaluation.

Mild lymphadenopathy in porta hepatis and celiac axis, suspicious
for metastatic disease.

Cholelithiasis. No radiographic evidence of cholecystitis.

## 2021-08-22 MED ORDER — GADOBUTROL 1 MMOL/ML IV SOLN
10.0000 mL | Freq: Once | INTRAVENOUS | Status: AC | PRN
  Administered 2021-08-22: 10 mL via INTRAVENOUS

## 2021-09-01 DIAGNOSIS — K8689 Other specified diseases of pancreas: Secondary | ICD-10-CM | POA: Diagnosis not present

## 2021-09-01 DIAGNOSIS — C250 Malignant neoplasm of head of pancreas: Secondary | ICD-10-CM | POA: Diagnosis not present

## 2021-09-01 DIAGNOSIS — E785 Hyperlipidemia, unspecified: Secondary | ICD-10-CM | POA: Diagnosis not present

## 2021-09-01 DIAGNOSIS — I10 Essential (primary) hypertension: Secondary | ICD-10-CM | POA: Diagnosis not present

## 2021-09-01 DIAGNOSIS — Z888 Allergy status to other drugs, medicaments and biological substances status: Secondary | ICD-10-CM | POA: Diagnosis not present

## 2021-09-01 DIAGNOSIS — C170 Malignant neoplasm of duodenum: Secondary | ICD-10-CM | POA: Diagnosis not present

## 2021-09-01 DIAGNOSIS — K269 Duodenal ulcer, unspecified as acute or chronic, without hemorrhage or perforation: Secondary | ICD-10-CM | POA: Diagnosis not present

## 2021-09-01 DIAGNOSIS — Z79899 Other long term (current) drug therapy: Secondary | ICD-10-CM | POA: Diagnosis not present

## 2021-09-03 DIAGNOSIS — K8689 Other specified diseases of pancreas: Secondary | ICD-10-CM | POA: Diagnosis not present

## 2021-09-03 DIAGNOSIS — C801 Malignant (primary) neoplasm, unspecified: Secondary | ICD-10-CM | POA: Diagnosis not present

## 2021-09-03 DIAGNOSIS — K831 Obstruction of bile duct: Secondary | ICD-10-CM | POA: Diagnosis not present

## 2021-09-03 DIAGNOSIS — R945 Abnormal results of liver function studies: Secondary | ICD-10-CM | POA: Diagnosis not present

## 2021-09-03 DIAGNOSIS — C259 Malignant neoplasm of pancreas, unspecified: Secondary | ICD-10-CM | POA: Diagnosis not present

## 2021-09-10 DIAGNOSIS — K8021 Calculus of gallbladder without cholecystitis with obstruction: Secondary | ICD-10-CM | POA: Diagnosis not present

## 2021-09-10 DIAGNOSIS — R110 Nausea: Secondary | ICD-10-CM | POA: Diagnosis not present

## 2021-09-10 DIAGNOSIS — N401 Enlarged prostate with lower urinary tract symptoms: Secondary | ICD-10-CM | POA: Diagnosis not present

## 2021-09-10 DIAGNOSIS — J328 Other chronic sinusitis: Secondary | ICD-10-CM | POA: Diagnosis not present

## 2021-09-10 DIAGNOSIS — G3184 Mild cognitive impairment, so stated: Secondary | ICD-10-CM | POA: Diagnosis not present

## 2021-09-10 DIAGNOSIS — I10 Essential (primary) hypertension: Secondary | ICD-10-CM | POA: Diagnosis not present

## 2021-09-10 DIAGNOSIS — R748 Abnormal levels of other serum enzymes: Secondary | ICD-10-CM | POA: Diagnosis not present

## 2021-09-10 DIAGNOSIS — E785 Hyperlipidemia, unspecified: Secondary | ICD-10-CM | POA: Diagnosis not present

## 2021-09-10 DIAGNOSIS — L718 Other rosacea: Secondary | ICD-10-CM | POA: Diagnosis not present

## 2021-09-18 DIAGNOSIS — K831 Obstruction of bile duct: Secondary | ICD-10-CM | POA: Diagnosis not present

## 2021-09-18 DIAGNOSIS — C250 Malignant neoplasm of head of pancreas: Secondary | ICD-10-CM | POA: Diagnosis not present

## 2021-09-18 DIAGNOSIS — N400 Enlarged prostate without lower urinary tract symptoms: Secondary | ICD-10-CM | POA: Diagnosis not present

## 2021-09-18 DIAGNOSIS — I10 Essential (primary) hypertension: Secondary | ICD-10-CM | POA: Diagnosis not present

## 2021-09-22 DIAGNOSIS — K831 Obstruction of bile duct: Secondary | ICD-10-CM | POA: Diagnosis not present

## 2021-09-22 DIAGNOSIS — K838 Other specified diseases of biliary tract: Secondary | ICD-10-CM | POA: Diagnosis not present

## 2021-09-22 DIAGNOSIS — C250 Malignant neoplasm of head of pancreas: Secondary | ICD-10-CM | POA: Diagnosis not present

## 2021-09-25 DIAGNOSIS — C250 Malignant neoplasm of head of pancreas: Secondary | ICD-10-CM | POA: Diagnosis not present

## 2021-09-25 DIAGNOSIS — C259 Malignant neoplasm of pancreas, unspecified: Secondary | ICD-10-CM | POA: Diagnosis not present

## 2021-09-25 DIAGNOSIS — C61 Malignant neoplasm of prostate: Secondary | ICD-10-CM | POA: Diagnosis not present

## 2021-09-25 DIAGNOSIS — Z452 Encounter for adjustment and management of vascular access device: Secondary | ICD-10-CM | POA: Diagnosis not present

## 2021-09-30 DIAGNOSIS — K8689 Other specified diseases of pancreas: Secondary | ICD-10-CM | POA: Diagnosis not present

## 2021-10-01 ENCOUNTER — Emergency Department (HOSPITAL_COMMUNITY)
Admission: EM | Admit: 2021-10-01 | Discharge: 2021-10-02 | Disposition: A | Discharge status: Other Acute Inpatient Hospital | Payer: 59 | Attending: Student | Admitting: Student

## 2021-10-01 ENCOUNTER — Encounter (HOSPITAL_COMMUNITY): Payer: Self-pay | Admitting: *Deleted

## 2021-10-01 ENCOUNTER — Other Ambulatory Visit: Payer: Self-pay

## 2021-10-01 DIAGNOSIS — R001 Bradycardia, unspecified: Secondary | ICD-10-CM | POA: Diagnosis not present

## 2021-10-01 DIAGNOSIS — Z79899 Other long term (current) drug therapy: Secondary | ICD-10-CM | POA: Diagnosis not present

## 2021-10-01 DIAGNOSIS — K5730 Diverticulosis of large intestine without perforation or abscess without bleeding: Secondary | ICD-10-CM | POA: Diagnosis not present

## 2021-10-01 DIAGNOSIS — K819 Cholecystitis, unspecified: Secondary | ICD-10-CM | POA: Insufficient documentation

## 2021-10-01 DIAGNOSIS — R7401 Elevation of levels of liver transaminase levels: Secondary | ICD-10-CM | POA: Diagnosis not present

## 2021-10-01 DIAGNOSIS — R1011 Right upper quadrant pain: Secondary | ICD-10-CM | POA: Diagnosis not present

## 2021-10-01 DIAGNOSIS — K8020 Calculus of gallbladder without cholecystitis without obstruction: Secondary | ICD-10-CM | POA: Diagnosis not present

## 2021-10-01 DIAGNOSIS — I10 Essential (primary) hypertension: Secondary | ICD-10-CM | POA: Diagnosis not present

## 2021-10-01 DIAGNOSIS — N281 Cyst of kidney, acquired: Secondary | ICD-10-CM | POA: Diagnosis not present

## 2021-10-01 DIAGNOSIS — R1084 Generalized abdominal pain: Secondary | ICD-10-CM | POA: Diagnosis not present

## 2021-10-01 DIAGNOSIS — R112 Nausea with vomiting, unspecified: Secondary | ICD-10-CM | POA: Diagnosis not present

## 2021-10-01 DIAGNOSIS — I2510 Atherosclerotic heart disease of native coronary artery without angina pectoris: Secondary | ICD-10-CM | POA: Diagnosis not present

## 2021-10-01 DIAGNOSIS — Z8507 Personal history of malignant neoplasm of pancreas: Secondary | ICD-10-CM | POA: Diagnosis not present

## 2021-10-01 DIAGNOSIS — K810 Acute cholecystitis: Secondary | ICD-10-CM | POA: Diagnosis not present

## 2021-10-01 DIAGNOSIS — C250 Malignant neoplasm of head of pancreas: Secondary | ICD-10-CM | POA: Diagnosis not present

## 2021-10-01 DIAGNOSIS — E119 Type 2 diabetes mellitus without complications: Secondary | ICD-10-CM | POA: Diagnosis not present

## 2021-10-01 DIAGNOSIS — K8689 Other specified diseases of pancreas: Secondary | ICD-10-CM | POA: Diagnosis not present

## 2021-10-01 LAB — COMPREHENSIVE METABOLIC PANEL
ALT: 86 U/L — ABNORMAL HIGH (ref 0–44)
AST: 126 U/L — ABNORMAL HIGH (ref 15–41)
Albumin: 3.6 g/dL (ref 3.5–5.0)
Alkaline Phosphatase: 245 U/L — ABNORMAL HIGH (ref 38–126)
Anion gap: 12 (ref 5–15)
BUN: 14 mg/dL (ref 8–23)
CO2: 23 mmol/L (ref 22–32)
Calcium: 8.8 mg/dL — ABNORMAL LOW (ref 8.9–10.3)
Chloride: 107 mmol/L (ref 98–111)
Creatinine, Ser: 1.07 mg/dL (ref 0.61–1.24)
GFR, Estimated: >60 mL/min (ref >60)
Glucose, Bld: 158 mg/dL — ABNORMAL HIGH (ref 70–99)
Potassium: 3.8 mmol/L (ref 3.5–5.1)
Sodium: 142 mmol/L (ref 135–145)
Total Bilirubin: 1.7 mg/dL — ABNORMAL HIGH (ref 0.3–1.2)
Total Protein: 6.4 g/dL — ABNORMAL LOW (ref 6.5–8.1)

## 2021-10-01 LAB — LIPASE, BLOOD: Lipase: 33 U/L (ref 11–51)

## 2021-10-01 LAB — CBC
HCT: 38.7 % — ABNORMAL LOW (ref 39.0–52.0)
Hemoglobin: 12.9 g/dL — ABNORMAL LOW (ref 13.0–17.0)
MCH: 32.3 pg (ref 26.0–34.0)
MCHC: 33.3 g/dL (ref 30.0–36.0)
MCV: 97 fL (ref 80.0–100.0)
Platelets: 197 10*3/uL (ref 150–400)
RBC: 3.99 MIL/uL — ABNORMAL LOW (ref 4.22–5.81)
RDW: 12.8 % (ref 11.5–15.5)
WBC: 8.3 10*3/uL (ref 4.0–10.5)
nRBC: 0 % (ref 0.0–0.2)

## 2021-10-01 MED ORDER — HYDROMORPHONE HCL 1 MG/ML IJ SOLN
1.0000 mg | Freq: Once | INTRAMUSCULAR | Status: AC
  Administered 2021-10-01: 1 mg via INTRAVENOUS
  Filled 2021-10-01: qty 1

## 2021-10-01 MED ORDER — ONDANSETRON HCL 4 MG/2ML IJ SOLN
4.0000 mg | Freq: Once | INTRAMUSCULAR | Status: AC
  Administered 2021-10-01: 4 mg via INTRAVENOUS
  Filled 2021-10-01: qty 2

## 2021-10-01 NOTE — ED Provider Notes (Signed)
[facility] EMERGENCY DEPARTMENT Provider Note  CSN: [id number] Arrival date & time: 10/01/21 2255  Chief Complaint(s) Abdominal Pain  HPI Joshua Forbes is a 66 y.o. male with PMH HTN, T2DM, OSA, pancreatic cancer with port in place scheduled to start chemotherapy tomorrow 10/02/2021 who presents the emergency department for evaluation of abdominal pain.  Patient had a PET scan this morning that did not identify any metastatic lesions, but does show cholelithiasis.  He states that he still has his gallbladder but also has a biliary stent in place.  He states that after eating dinner he had acute onset right upper quadrant pain that was so intense that he needed to call an ambulance and be brought to the nearest facility as his pain was out of control.  Patient received 200 of fentanyl and 4 Zofran prior to arrival and appears uncomfortable.  He endorses nausea but denies vomiting, chest pain, shortness of breath, fever or other systemic symptoms.   Past Medical History Past Medical History:  Diagnosis Date   Hypertension    Prediabetes    Sleep apnea    Patient Active Problem List   Diagnosis Date Noted   OBESITY 08/21/2009   NEVI, MULTIPLE 05/21/2008   DEPRESSION 05/21/2008   SKIN TAG 05/21/2008   SLEEP DISORDER 05/21/2008   Essential hypertension 11/21/2007   ALLERGIC RHINITIS 11/21/2007   INGUINAL HERNIA, RIGHT 11/21/2007   Home Medication(s) Prior to Admission medications   Medication Sig Start Date End Date Taking? Authorizing Provider  losartan (COZAAR) 50 MG tablet Take 50 mg by mouth daily.    [provider]  tamsulosin (FLOMAX) 0.4 MG CAPS capsule Take 0.4 mg by mouth every other day.    [provider]  Turmeric 500 MG TABS Take 1,000 mg by mouth 2 (two) times daily.    [provider]                                                                                                                                    Past  Surgical History Past Surgical History:  Procedure Laterality Date   COLONOSCOPY WITH PROPOFOL N/A 05/17/2020   Procedure: COLONOSCOPY WITH PROPOFOL;  Surgeon: Eloise Harman, DO;  Location: [facility];  Service: Endoscopy;  Laterality: N/A;  12:45   HERNIA REPAIR     Family History Family History  Problem Relation Age of Onset   Colon cancer Father    Diabetes Brother    Heart disease Neg Hx     Social History Social History   Tobacco Use   Smoking status: Never   Smokeless tobacco: Never  Substance Use Topics   Alcohol use: No   Drug use: No   Allergies Lisinopril  Review of Systems Review of Systems  Gastrointestinal:  Positive for abdominal pain and nausea.   Physical Exam Vital Signs  I have reviewed the triage vital signs BP Marland Kitchen)  185/76 (BP Location: Right Arm)   Pulse (!) 46   Temp 98.3 F (36.8 C)   Resp 20   Ht 6' (1.829 m)   Wt 94.3 kg   SpO2 98%   BMI 28.21 kg/m   Physical Exam Vitals and nursing note reviewed.  Constitutional:      General: He is in acute distress.     Appearance: He is well-developed. He is ill-appearing.  HENT:     Head: Normocephalic and atraumatic.  Eyes:     Conjunctiva/sclera: Conjunctivae normal.  Cardiovascular:     Rate and Rhythm: Normal rate and regular rhythm.     Heart sounds: No murmur heard. Pulmonary:     Effort: Pulmonary effort is normal. No respiratory distress.     Breath sounds: Normal breath sounds.  Abdominal:     Palpations: Abdomen is soft.     Tenderness: There is abdominal tenderness in the right upper quadrant.  Musculoskeletal:        General: No swelling.     Cervical back: Neck supple.  Skin:    General: Skin is warm and dry.     Capillary Refill: Capillary refill takes less than 2 seconds.  Neurological:     Mental Status: He is alert.  Psychiatric:        Mood and Affect: Mood normal.    ED Results and Treatments Labs (all labs ordered are listed, but only abnormal results  are displayed) Labs Reviewed  CBC - Abnormal; Notable for the following components:      Result Value   RBC 3.99 (*)    Hemoglobin 12.9 (*)    HCT 38.7 (*)    All other components within normal limits  LIPASE, BLOOD  COMPREHENSIVE METABOLIC PANEL  URINALYSIS, ROUTINE W REFLEX MICROSCOPIC                                                                                                                          Radiology No results found.  Pertinent labs & imaging results that were available during my care of the patient were reviewed by me and considered in my medical decision making (see MDM for details).  Medications Ordered in ED Medications  HYDROmorphone (DILAUDID) injection 1 mg (1 mg Intravenous Given 10/01/21 2316)  ondansetron (ZOFRAN) injection 4 mg (4 mg Intravenous Given 10/01/21 2316)  Procedures Procedures  (including critical care time)  Medical Decision Making / ED Course   This patient presents to the ED for concern of ***, this involves an extensive number of treatment options, and is a complaint that carries with it a high risk of complications and morbidity.  The differential diagnosis includes ***  MDM: ***   Additional history obtained: -Additional history obtained from *** -External records from outside source obtained and reviewed including: Chart review including previous notes, labs, imaging, consultation notes   Lab Tests: -I ordered, reviewed, and interpreted labs.   The pertinent results include:   Labs Reviewed  CBC - Abnormal; Notable for the following components:      Result Value   RBC 3.99 (*)    Hemoglobin 12.9 (*)    HCT 38.7 (*)    All other components within normal limits  LIPASE, BLOOD  COMPREHENSIVE METABOLIC PANEL  URINALYSIS, ROUTINE W REFLEX MICROSCOPIC      EKG ***  EKG  Interpretation  Date/Time:  Wednesday Oct 01 2021 23:20:23 EDT Ventricular Rate:  45 PR Interval:  162 QRS Duration: 134 QT Interval:  490 QTC Calculation: 423 R Axis:   14 Text Interpretation: Sinus bradycardia Right bundle branch block When compared with ECG of 13-Mar-2008 11:37, PREVIOUS ECG IS PRESENT Confirmed by [place] ([id number]) on 10/01/2021 11:45:02 PM         Imaging Studies ordered: I ordered imaging studies including *** I independently visualized and interpreted imaging. I agree with the radiologist interpretation   Medicines ordered and prescription drug management: Meds ordered this encounter  Medications   HYDROmorphone (DILAUDID) injection 1 mg   ondansetron (ZOFRAN) injection 4 mg    -I have reviewed the patients home medicines and have made adjustments as needed  Critical interventions ***  Consultations Obtained: I requested consultation with the ***,  and discussed lab and imaging findings as well as pertinent plan - they recommend: ***   Cardiac Monitoring: The patient was maintained on a cardiac monitor.  I personally viewed and interpreted the cardiac monitored which showed an underlying rhythm of: ***  Social Determinants of Health:  Factors impacting patients care include: ***   Reevaluation: After the interventions noted above, I reevaluated the patient and found that they have :{resolved/improved/worsened:23923::"improved"}  Co morbidities that complicate the patient evaluation  Past Medical History:  Diagnosis Date   Hypertension    Prediabetes    Sleep apnea       Dispostion: I considered admission for this patient, ***     Final Clinical Impression(s) / ED Diagnoses Final diagnoses:  None     '@PCDICTATION'$ @

## 2021-10-01 NOTE — ED Triage Notes (Signed)
Pt was enroute to the hospital when pain became too severe; pulled over to call EMS. Pt c/o severe RUQ pain with NV, last ate at 1830. Pt had stent placed for gallbladder two weeks ago, had recent port-a-cath placed to R chest to start chemo for prostate cancer. Reports potential cholecystectomy  after 51month of chemo.  Given 206m fentanyl and '4mg'$  zofran; 18g IV L AC

## 2021-10-02 ENCOUNTER — Emergency Department (HOSPITAL_COMMUNITY): Payer: 59

## 2021-10-02 DIAGNOSIS — I10 Essential (primary) hypertension: Secondary | ICD-10-CM | POA: Diagnosis not present

## 2021-10-02 DIAGNOSIS — K810 Acute cholecystitis: Secondary | ICD-10-CM | POA: Diagnosis not present

## 2021-10-02 DIAGNOSIS — Z9689 Presence of other specified functional implants: Secondary | ICD-10-CM | POA: Diagnosis not present

## 2021-10-02 DIAGNOSIS — N281 Cyst of kidney, acquired: Secondary | ICD-10-CM | POA: Diagnosis not present

## 2021-10-02 DIAGNOSIS — K831 Obstruction of bile duct: Secondary | ICD-10-CM | POA: Diagnosis not present

## 2021-10-02 DIAGNOSIS — K8000 Calculus of gallbladder with acute cholecystitis without obstruction: Secondary | ICD-10-CM | POA: Diagnosis not present

## 2021-10-02 DIAGNOSIS — K8689 Other specified diseases of pancreas: Secondary | ICD-10-CM | POA: Diagnosis not present

## 2021-10-02 DIAGNOSIS — K8020 Calculus of gallbladder without cholecystitis without obstruction: Secondary | ICD-10-CM | POA: Diagnosis not present

## 2021-10-02 DIAGNOSIS — K5730 Diverticulosis of large intestine without perforation or abscess without bleeding: Secondary | ICD-10-CM | POA: Diagnosis not present

## 2021-10-02 DIAGNOSIS — R590 Localized enlarged lymph nodes: Secondary | ICD-10-CM | POA: Diagnosis not present

## 2021-10-02 DIAGNOSIS — K819 Cholecystitis, unspecified: Secondary | ICD-10-CM | POA: Diagnosis not present

## 2021-10-02 DIAGNOSIS — C259 Malignant neoplasm of pancreas, unspecified: Secondary | ICD-10-CM | POA: Diagnosis not present

## 2021-10-02 DIAGNOSIS — C250 Malignant neoplasm of head of pancreas: Secondary | ICD-10-CM | POA: Diagnosis not present

## 2021-10-02 LAB — URINALYSIS, ROUTINE W REFLEX MICROSCOPIC
Bilirubin Urine: NEGATIVE
Glucose, UA: NEGATIVE mg/dL
Hgb urine dipstick: NEGATIVE
Ketones, ur: 20 mg/dL — AB
Leukocytes,Ua: NEGATIVE
Nitrite: NEGATIVE
Protein, ur: NEGATIVE mg/dL
Specific Gravity, Urine: 1.017 (ref 1.005–1.030)
pH: 6 (ref 5.0–8.0)

## 2021-10-02 IMAGING — CT CT ABD-PELV W/ CM
2 of 5 series · 16 of 46 positions shown, 18 images · IV contrast (APPLIED)
Comparison: MRI abdomen [DATE]

CLINICAL DATA: Epigastric and right upper quadrant pain. History of
biliary stent and pancreatic cancer.

EXAM:
CT ABDOMEN AND PELVIS WITH CONTRAST
TECHNIQUE: Multidetector CT imaging of the abdomen and pelvis was performed
using the standard protocol following bolus administration of
intravenous contrast.

[Series 3: abdomen 5.0 · axial · 0.93mm/px · z∈[-562,-142]mm · 13 of 96 slices shown, 15 images]
[im 6/96  soft-tissue]
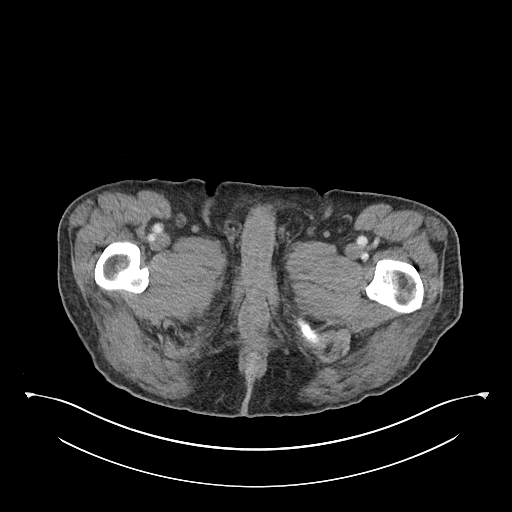
[im 6/96  bone]
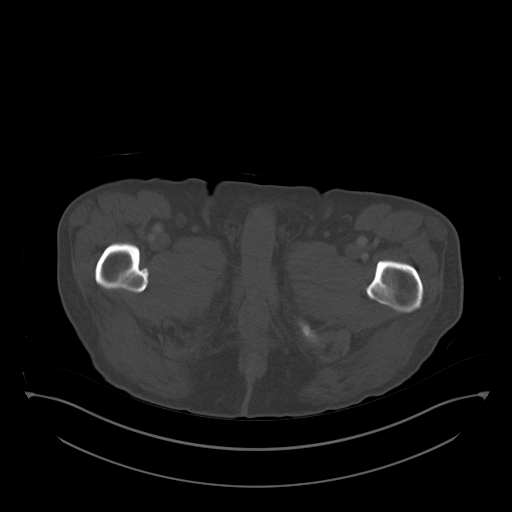
[im 12/96  soft-tissue]
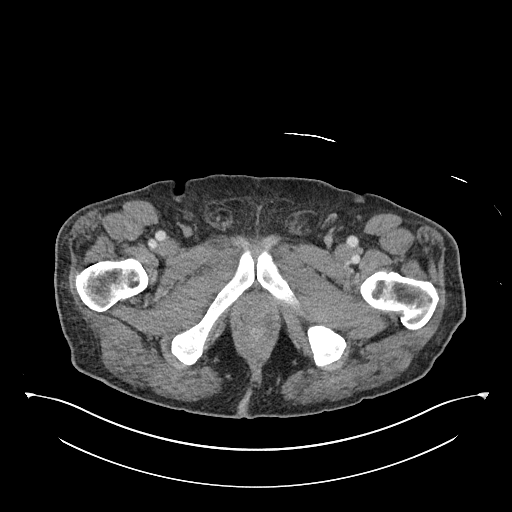
[im 23/96  soft-tissue]
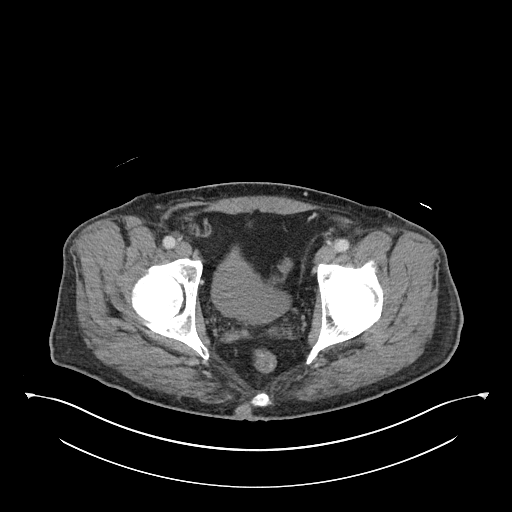
[im 28/96  soft-tissue]
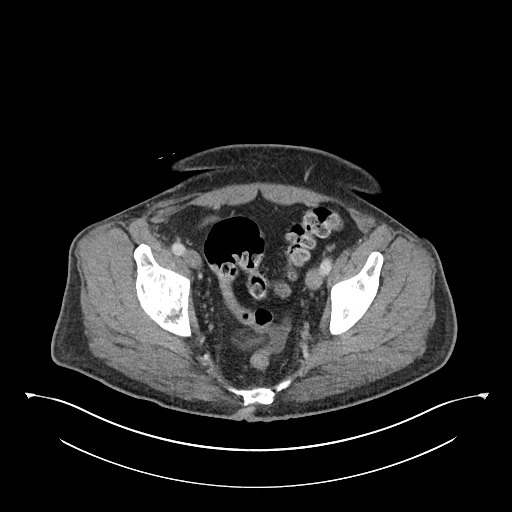
[im 34/96  soft-tissue]
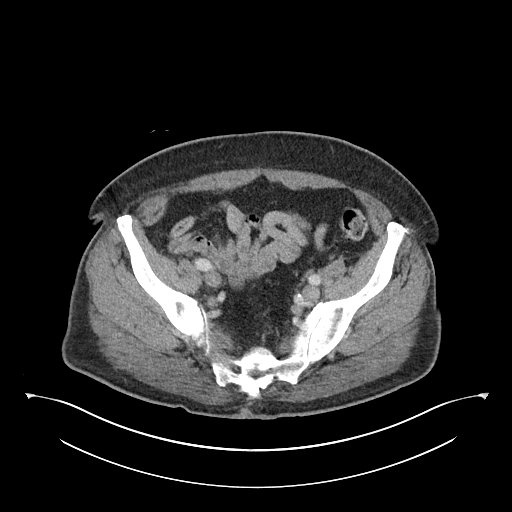
[im 40/96  soft-tissue]
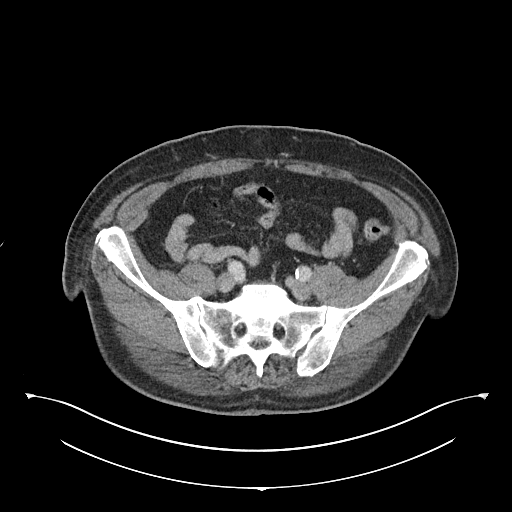
[im 51/96  soft-tissue]
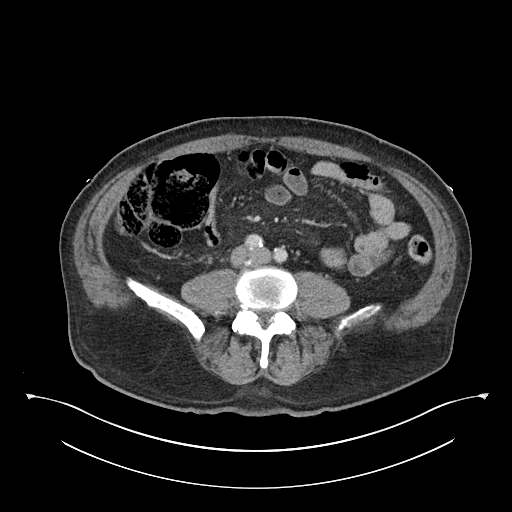
[im 56/96  soft-tissue]
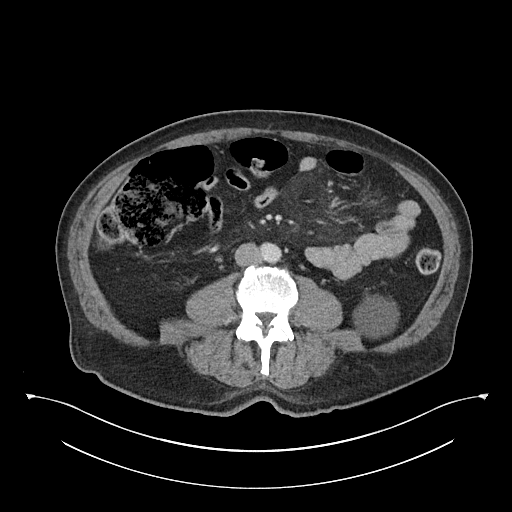
[im 62/96  soft-tissue]
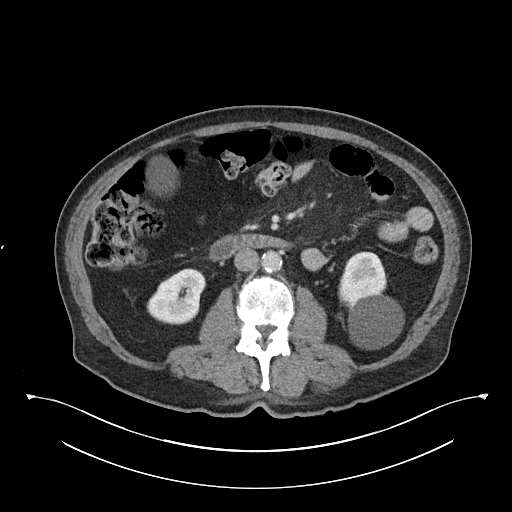
[im 62/96  bone]
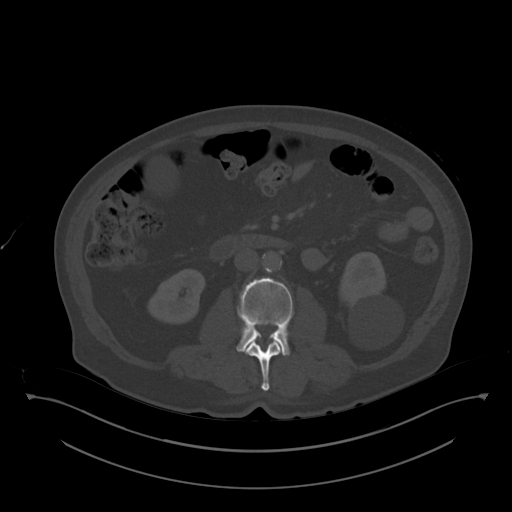
[im 68/96  soft-tissue]
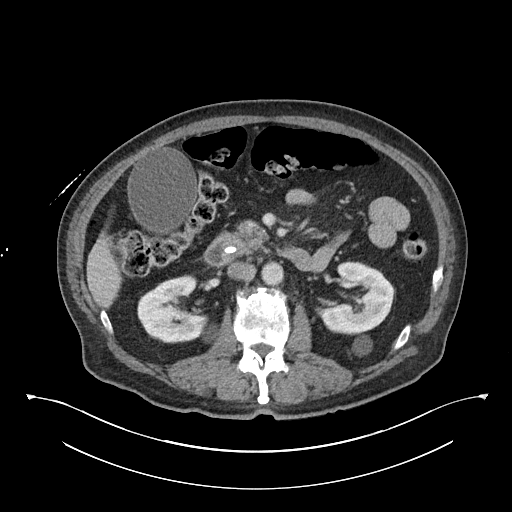
[im 73/96  soft-tissue]
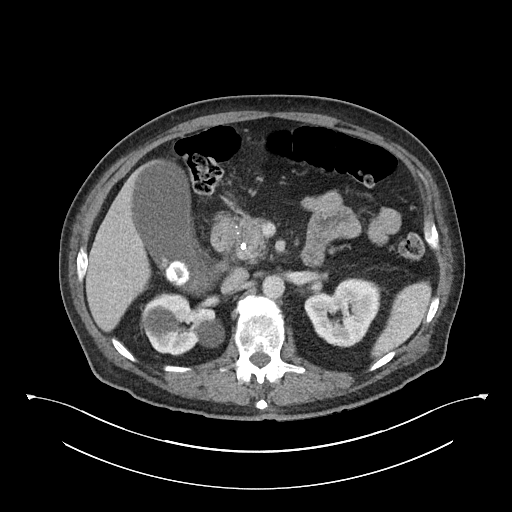
[im 84/96  soft-tissue]
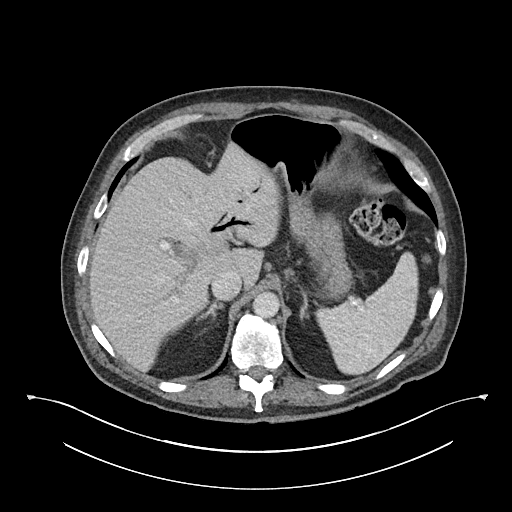
[im 90/96  soft-tissue]
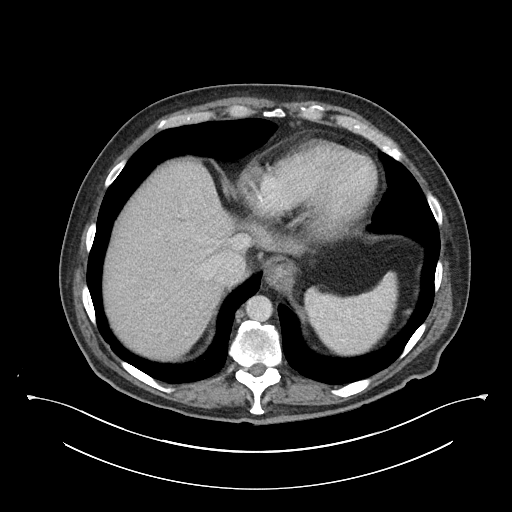

[Series 6: abdomen 3.0 mpr cor · coronal · 0.80mm/px · 3 of 106 slices shown]
[im 36/106  soft-tissue]
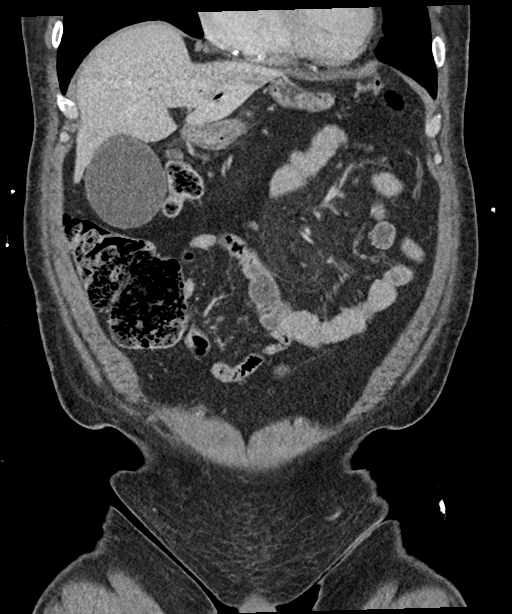
[im 47/106  soft-tissue]
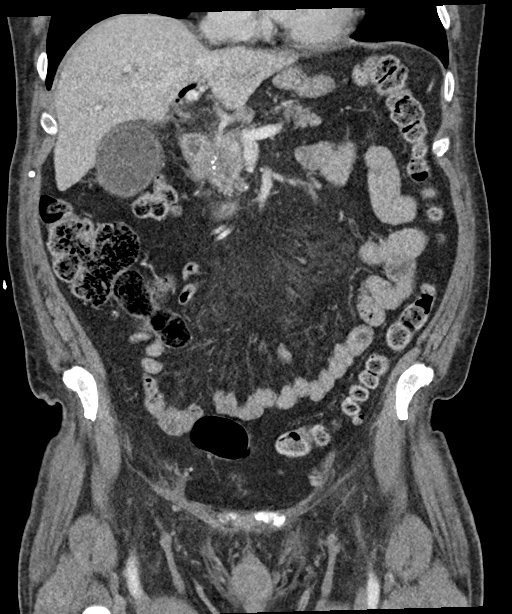
[im 59/106  soft-tissue]
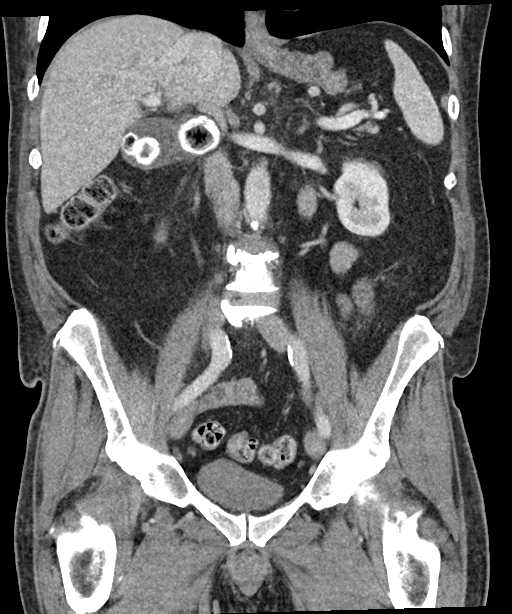

[16 of 46 positions shown; findings below may reference images not displayed]

RADIATION DOSE REDUCTION: This exam was performed according to the
departmental dose-optimization program which includes automated
exposure control, adjustment of the mA and/or kV according to
patient size and/or use of iterative reconstruction technique.

CONTRAST:  80mL OMNIPAQUE IOHEXOL 300 MG/ML  SOLN
FINDINGS: Lower chest: No acute abnormality.

Hepatobiliary: New biliary stent is in place. There is new
pneumobilia compatible with stent placement. No biliary ductal
dilatation identified. Multiple gallstones are again seen. The
gallbladder is dilated and there is mild gallbladder wall edema. No
focal liver lesions are seen.

Pancreas: Pancreatic head mass on MRI is not well defined on this
study measuring approximally 2.4 x 1.8 cm. The remaining pancreas is
unremarkable.

Spleen: Normal in size without focal abnormality.

Adrenals/Urinary Tract: Bilateral adrenal glands are within normal
limits. Bilateral renal cysts are again seen measuring up to 6 cm on
the left. There is no hydronephrosis or perinephric fluid. The
bladder is within normal limits.

Stomach/Bowel: Stomach is within normal limits. Appendix appears
normal. No evidence of bowel wall thickening, distention, or
inflammatory changes. There is sigmoid colon diverticulosis without
evidence for acute diverticulitis.

Vascular/Lymphatic: Aorta and IVC are normal in size. There are
atherosclerotic calcifications of the aorta. Enlarged periportal and
portacaval lymph nodes are again seen measuring up to 12 mm, similar
to the prior study.

Reproductive: Prostate gland is enlarged.

Other: Small amount of free fluid in the pelvis. There has likely
been prior right inguinal hernia repair. Small fat containing left
inguinal hernia present.

Musculoskeletal: Multilevel degenerative changes affect the spine.
IMPRESSION: 1. The gallbladder is dilated. There is cholelithiasis with mild
gallbladder wall thickening/edema. Correlate clinically for acute
cholecystitis.
2. Common bile duct stent in place.
3. Pancreatic head mass is grossly unchanged.
4. Periportal and portacaval lymphadenopathy is unchanged.
5. Small amount of ascites.

## 2021-10-02 MED ORDER — PIPERACILLIN-TAZOBACTAM 3.375 G IVPB
3.3750 g | Freq: Three times a day (TID) | INTRAVENOUS | Status: DC
  Administered 2021-10-02: 3.375 g via INTRAVENOUS
  Filled 2021-10-02: qty 50

## 2021-10-02 MED ORDER — IOHEXOL 300 MG/ML  SOLN
80.0000 mL | Freq: Once | INTRAMUSCULAR | Status: AC | PRN
Start: 2021-10-02 — End: 2021-10-02
  Administered 2021-10-02: 80 mL via INTRAVENOUS

## 2021-10-02 MED ORDER — HYDROMORPHONE HCL 1 MG/ML IJ SOLN
1.0000 mg | Freq: Once | INTRAMUSCULAR | Status: AC
Start: 2021-10-02 — End: 2021-10-02
  Administered 2021-10-02: 1 mg via INTRAVENOUS
  Filled 2021-10-02: qty 1

## 2021-10-02 NOTE — ED Notes (Signed)
CT to push images through to [facility]

## 2021-10-02 NOTE — Progress Notes (Addendum)
HOSPITAL MEDICINE NOTE  66 year old male with past medical history BPH, hypertension and recent diagnosis of adenocarcinoma of the pancreas who follows with Dr. Harlow Asa with Oncology and Dr Crisoforo Oxford with surgery at [facility] presenting to [facility] emergency department with abdominal pain nausea and vomiting.  Of note, patient originally saw his PCP in April and was found to have abnormal LFTs.  Patient was then sent for an abdominal ultrasound which revealed moderate cholelithiasis and evidence of acute cholecystitis.  Patient was referred to Dr. Alice Reichert with gastroenterology and was seen on 4/20 and at that time patient was redemonstrated to have markedly abnormal LFTs.  MRI of the abdomen with MRCP was performed revealing diffuse biliary ductal dilatation seen to the level of the pancreatic head with poorly defined hypovascular area suspicious for pancreatic carcinoma.  Patient was then referred for EGD and EUS performed on 5/1 revealing a nonbleeding cratered small duodenal ulcer with a rounded mass identified in the pancreatic head.  A biopsy was performed revealing infiltrating adenocarcinoma moderately to poorly differentiated.  Patient has since undergone Port-A-Cath placement as well as ERCP with stent placement.    Patient is now presenting to [facility] emergency department via EMS due to progressive abdominal pain despite these interventions.  ER provider has discussed case with Dr. Sabra Heck with surgery at [facility] and they have accepted the patient for transfer for consideration of cholecystectomy.  At the moment they have no beds.  ER provider has additionally discussed case with Dr. Rosendo Gros with general surgery here at [facility] who additionally recommends that patient go to [facility] for intervention if possible.  Afterwards Dr.  Matilde Sprang has discussed the case with myself.  Considering all the information above as well as the fact that patient is already accepted for transfer to the [facility] surgical  service, I have recommended that our tentative plan for the time being should be for the patient to remain in the emergency departmentn the hopes that a bed will become available and patient can quickly go to [facility] to receive definitive care there.  This would be the best case scenario.  If the patient remains in the [facility] emergency department for greater than 12 hours (around noon or so on 6/1),  [facility] can reconsider admitting the patient to our facility at that time to continue to wait for a [facility] bed as long as surgery and possibly even GI are willing to consult and provide support.    Vernelle Emerald  MD [facility]

## 2021-10-02 NOTE — ED Notes (Signed)
Pt transported to [facility] ED for continuing care.

## 2021-10-03 DIAGNOSIS — C250 Malignant neoplasm of head of pancreas: Secondary | ICD-10-CM | POA: Diagnosis not present

## 2021-10-14 DIAGNOSIS — K563 Gallstone ileus: Secondary | ICD-10-CM | POA: Diagnosis not present

## 2021-10-14 DIAGNOSIS — C250 Malignant neoplasm of head of pancreas: Secondary | ICD-10-CM | POA: Diagnosis not present

## 2021-10-14 DIAGNOSIS — K829 Disease of gallbladder, unspecified: Secondary | ICD-10-CM | POA: Diagnosis not present

## 2021-10-20 DIAGNOSIS — K810 Acute cholecystitis: Secondary | ICD-10-CM | POA: Diagnosis not present

## 2021-10-20 DIAGNOSIS — C250 Malignant neoplasm of head of pancreas: Secondary | ICD-10-CM | POA: Diagnosis not present

## 2021-10-21 DIAGNOSIS — K819 Cholecystitis, unspecified: Secondary | ICD-10-CM | POA: Diagnosis not present

## 2021-10-21 DIAGNOSIS — K838 Other specified diseases of biliary tract: Secondary | ICD-10-CM | POA: Diagnosis not present

## 2021-10-21 DIAGNOSIS — C250 Malignant neoplasm of head of pancreas: Secondary | ICD-10-CM | POA: Diagnosis not present

## 2021-10-21 DIAGNOSIS — Z9689 Presence of other specified functional implants: Secondary | ICD-10-CM | POA: Diagnosis not present

## 2021-10-21 DIAGNOSIS — Z4659 Encounter for fitting and adjustment of other gastrointestinal appliance and device: Secondary | ICD-10-CM | POA: Diagnosis not present

## 2021-10-21 DIAGNOSIS — K831 Obstruction of bile duct: Secondary | ICD-10-CM | POA: Diagnosis not present

## 2021-10-22 DIAGNOSIS — C250 Malignant neoplasm of head of pancreas: Secondary | ICD-10-CM | POA: Diagnosis not present

## 2021-10-23 DIAGNOSIS — C250 Malignant neoplasm of head of pancreas: Secondary | ICD-10-CM | POA: Diagnosis not present

## 2021-10-23 DIAGNOSIS — Z5111 Encounter for antineoplastic chemotherapy: Secondary | ICD-10-CM | POA: Diagnosis not present

## 2021-10-25 DIAGNOSIS — Z7689 Persons encountering health services in other specified circumstances: Secondary | ICD-10-CM | POA: Diagnosis not present

## 2021-10-25 DIAGNOSIS — C250 Malignant neoplasm of head of pancreas: Secondary | ICD-10-CM | POA: Diagnosis not present

## 2021-10-29 DIAGNOSIS — C250 Malignant neoplasm of head of pancreas: Secondary | ICD-10-CM | POA: Diagnosis not present

## 2021-10-29 DIAGNOSIS — D72829 Elevated white blood cell count, unspecified: Secondary | ICD-10-CM | POA: Diagnosis not present

## 2021-10-29 DIAGNOSIS — Z09 Encounter for follow-up examination after completed treatment for conditions other than malignant neoplasm: Secondary | ICD-10-CM | POA: Diagnosis not present

## 2021-11-06 DIAGNOSIS — K269 Duodenal ulcer, unspecified as acute or chronic, without hemorrhage or perforation: Secondary | ICD-10-CM | POA: Diagnosis not present

## 2021-11-06 DIAGNOSIS — Z95828 Presence of other vascular implants and grafts: Secondary | ICD-10-CM | POA: Diagnosis not present

## 2021-11-06 DIAGNOSIS — I10 Essential (primary) hypertension: Secondary | ICD-10-CM | POA: Diagnosis not present

## 2021-11-06 DIAGNOSIS — Z5111 Encounter for antineoplastic chemotherapy: Secondary | ICD-10-CM | POA: Diagnosis not present

## 2021-11-06 DIAGNOSIS — C250 Malignant neoplasm of head of pancreas: Secondary | ICD-10-CM | POA: Diagnosis not present

## 2021-11-06 DIAGNOSIS — N400 Enlarged prostate without lower urinary tract symptoms: Secondary | ICD-10-CM | POA: Diagnosis not present

## 2021-11-06 DIAGNOSIS — R439 Unspecified disturbances of smell and taste: Secondary | ICD-10-CM | POA: Diagnosis not present

## 2021-11-06 DIAGNOSIS — K831 Obstruction of bile duct: Secondary | ICD-10-CM | POA: Diagnosis not present

## 2021-11-06 DIAGNOSIS — Z79899 Other long term (current) drug therapy: Secondary | ICD-10-CM | POA: Diagnosis not present

## 2021-11-20 DIAGNOSIS — K831 Obstruction of bile duct: Secondary | ICD-10-CM | POA: Diagnosis not present

## 2021-11-20 DIAGNOSIS — I10 Essential (primary) hypertension: Secondary | ICD-10-CM | POA: Diagnosis not present

## 2021-11-20 DIAGNOSIS — C250 Malignant neoplasm of head of pancreas: Secondary | ICD-10-CM | POA: Diagnosis not present

## 2021-11-20 DIAGNOSIS — N401 Enlarged prostate with lower urinary tract symptoms: Secondary | ICD-10-CM | POA: Diagnosis not present

## 2021-11-24 DIAGNOSIS — C250 Malignant neoplasm of head of pancreas: Secondary | ICD-10-CM | POA: Diagnosis not present

## 2021-11-25 DIAGNOSIS — C250 Malignant neoplasm of head of pancreas: Secondary | ICD-10-CM | POA: Diagnosis not present

## 2021-11-25 DIAGNOSIS — Z5111 Encounter for antineoplastic chemotherapy: Secondary | ICD-10-CM | POA: Diagnosis not present

## 2021-12-09 DIAGNOSIS — R9389 Abnormal findings on diagnostic imaging of other specified body structures: Secondary | ICD-10-CM | POA: Diagnosis not present

## 2021-12-09 DIAGNOSIS — Z5111 Encounter for antineoplastic chemotherapy: Secondary | ICD-10-CM | POA: Diagnosis not present

## 2021-12-09 DIAGNOSIS — R937 Abnormal findings on diagnostic imaging of other parts of musculoskeletal system: Secondary | ICD-10-CM | POA: Diagnosis not present

## 2021-12-09 DIAGNOSIS — N401 Enlarged prostate with lower urinary tract symptoms: Secondary | ICD-10-CM | POA: Diagnosis not present

## 2021-12-09 DIAGNOSIS — K831 Obstruction of bile duct: Secondary | ICD-10-CM | POA: Diagnosis not present

## 2021-12-09 DIAGNOSIS — C250 Malignant neoplasm of head of pancreas: Secondary | ICD-10-CM | POA: Diagnosis not present

## 2021-12-09 DIAGNOSIS — Z79899 Other long term (current) drug therapy: Secondary | ICD-10-CM | POA: Diagnosis not present

## 2021-12-09 DIAGNOSIS — I10 Essential (primary) hypertension: Secondary | ICD-10-CM | POA: Diagnosis not present

## 2021-12-09 DIAGNOSIS — Z758 Other problems related to medical facilities and other health care: Secondary | ICD-10-CM | POA: Diagnosis not present

## 2021-12-11 DIAGNOSIS — I10 Essential (primary) hypertension: Secondary | ICD-10-CM | POA: Diagnosis not present

## 2021-12-11 DIAGNOSIS — R7303 Prediabetes: Secondary | ICD-10-CM | POA: Diagnosis not present

## 2021-12-11 DIAGNOSIS — Z125 Encounter for screening for malignant neoplasm of prostate: Secondary | ICD-10-CM | POA: Diagnosis not present

## 2021-12-11 DIAGNOSIS — Z808 Family history of malignant neoplasm of other organs or systems: Secondary | ICD-10-CM | POA: Diagnosis not present

## 2021-12-11 DIAGNOSIS — Z7689 Persons encountering health services in other specified circumstances: Secondary | ICD-10-CM | POA: Diagnosis not present

## 2021-12-11 DIAGNOSIS — E785 Hyperlipidemia, unspecified: Secondary | ICD-10-CM | POA: Diagnosis not present

## 2021-12-11 DIAGNOSIS — L989 Disorder of the skin and subcutaneous tissue, unspecified: Secondary | ICD-10-CM | POA: Diagnosis not present

## 2021-12-11 DIAGNOSIS — N400 Enlarged prostate without lower urinary tract symptoms: Secondary | ICD-10-CM | POA: Diagnosis not present

## 2021-12-11 DIAGNOSIS — C250 Malignant neoplasm of head of pancreas: Secondary | ICD-10-CM | POA: Diagnosis not present

## 2021-12-22 DIAGNOSIS — Z8507 Personal history of malignant neoplasm of pancreas: Secondary | ICD-10-CM | POA: Diagnosis not present

## 2021-12-22 DIAGNOSIS — K8689 Other specified diseases of pancreas: Secondary | ICD-10-CM | POA: Diagnosis not present

## 2021-12-22 DIAGNOSIS — C250 Malignant neoplasm of head of pancreas: Secondary | ICD-10-CM | POA: Diagnosis not present

## 2021-12-22 DIAGNOSIS — N281 Cyst of kidney, acquired: Secondary | ICD-10-CM | POA: Diagnosis not present

## 2021-12-22 DIAGNOSIS — K8020 Calculus of gallbladder without cholecystitis without obstruction: Secondary | ICD-10-CM | POA: Diagnosis not present

## 2021-12-23 DIAGNOSIS — R220 Localized swelling, mass and lump, head: Secondary | ICD-10-CM | POA: Diagnosis not present

## 2021-12-23 DIAGNOSIS — Z79899 Other long term (current) drug therapy: Secondary | ICD-10-CM | POA: Diagnosis not present

## 2021-12-23 DIAGNOSIS — C250 Malignant neoplasm of head of pancreas: Secondary | ICD-10-CM | POA: Diagnosis not present

## 2021-12-25 DIAGNOSIS — Z7689 Persons encountering health services in other specified circumstances: Secondary | ICD-10-CM | POA: Diagnosis not present

## 2021-12-25 DIAGNOSIS — C250 Malignant neoplasm of head of pancreas: Secondary | ICD-10-CM | POA: Diagnosis not present

## 2021-12-30 DIAGNOSIS — C250 Malignant neoplasm of head of pancreas: Secondary | ICD-10-CM | POA: Diagnosis not present

## 2021-12-30 DIAGNOSIS — J341 Cyst and mucocele of nose and nasal sinus: Secondary | ICD-10-CM | POA: Diagnosis not present

## 2021-12-30 DIAGNOSIS — J3489 Other specified disorders of nose and nasal sinuses: Secondary | ICD-10-CM | POA: Diagnosis not present

## 2021-12-30 DIAGNOSIS — R220 Localized swelling, mass and lump, head: Secondary | ICD-10-CM | POA: Diagnosis not present

## 2021-12-30 DIAGNOSIS — J320 Chronic maxillary sinusitis: Secondary | ICD-10-CM | POA: Diagnosis not present

## 2022-01-07 DIAGNOSIS — R220 Localized swelling, mass and lump, head: Secondary | ICD-10-CM | POA: Diagnosis not present

## 2022-01-07 DIAGNOSIS — J320 Chronic maxillary sinusitis: Secondary | ICD-10-CM | POA: Diagnosis not present

## 2022-01-07 DIAGNOSIS — Z79899 Other long term (current) drug therapy: Secondary | ICD-10-CM | POA: Diagnosis not present

## 2022-01-07 DIAGNOSIS — Z5111 Encounter for antineoplastic chemotherapy: Secondary | ICD-10-CM | POA: Diagnosis not present

## 2022-01-07 DIAGNOSIS — C250 Malignant neoplasm of head of pancreas: Secondary | ICD-10-CM | POA: Diagnosis not present

## 2022-01-08 DIAGNOSIS — R1031 Right lower quadrant pain: Secondary | ICD-10-CM | POA: Diagnosis not present

## 2022-01-09 DIAGNOSIS — C250 Malignant neoplasm of head of pancreas: Secondary | ICD-10-CM | POA: Diagnosis not present

## 2022-01-09 DIAGNOSIS — Z7689 Persons encountering health services in other specified circumstances: Secondary | ICD-10-CM | POA: Diagnosis not present

## 2022-01-12 DIAGNOSIS — J3489 Other specified disorders of nose and nasal sinuses: Secondary | ICD-10-CM | POA: Diagnosis not present

## 2022-01-12 DIAGNOSIS — J342 Deviated nasal septum: Secondary | ICD-10-CM | POA: Diagnosis not present

## 2022-01-19 DIAGNOSIS — J320 Chronic maxillary sinusitis: Secondary | ICD-10-CM | POA: Diagnosis not present

## 2022-01-19 DIAGNOSIS — D140 Benign neoplasm of middle ear, nasal cavity and accessory sinuses: Secondary | ICD-10-CM | POA: Diagnosis not present

## 2022-01-19 DIAGNOSIS — J3489 Other specified disorders of nose and nasal sinuses: Secondary | ICD-10-CM | POA: Diagnosis not present

## 2022-01-19 DIAGNOSIS — J342 Deviated nasal septum: Secondary | ICD-10-CM | POA: Diagnosis not present

## 2022-01-21 DIAGNOSIS — Z9221 Personal history of antineoplastic chemotherapy: Secondary | ICD-10-CM | POA: Diagnosis not present

## 2022-01-21 DIAGNOSIS — Z79899 Other long term (current) drug therapy: Secondary | ICD-10-CM | POA: Diagnosis not present

## 2022-01-21 DIAGNOSIS — Z87898 Personal history of other specified conditions: Secondary | ICD-10-CM | POA: Diagnosis not present

## 2022-01-21 DIAGNOSIS — C250 Malignant neoplasm of head of pancreas: Secondary | ICD-10-CM | POA: Diagnosis not present

## 2022-01-21 DIAGNOSIS — R6889 Other general symptoms and signs: Secondary | ICD-10-CM | POA: Diagnosis not present

## 2022-01-21 DIAGNOSIS — Z09 Encounter for follow-up examination after completed treatment for conditions other than malignant neoplasm: Secondary | ICD-10-CM | POA: Diagnosis not present

## 2022-01-21 DIAGNOSIS — Z9889 Other specified postprocedural states: Secondary | ICD-10-CM | POA: Diagnosis not present

## 2022-01-21 DIAGNOSIS — J320 Chronic maxillary sinusitis: Secondary | ICD-10-CM | POA: Diagnosis not present

## 2022-01-22 ENCOUNTER — Ambulatory Visit: Payer: Self-pay | Admitting: Dermatology

## 2022-01-23 DIAGNOSIS — C250 Malignant neoplasm of head of pancreas: Secondary | ICD-10-CM | POA: Diagnosis not present

## 2022-01-23 DIAGNOSIS — Z7689 Persons encountering health services in other specified circumstances: Secondary | ICD-10-CM | POA: Diagnosis not present

## 2022-02-04 DIAGNOSIS — C250 Malignant neoplasm of head of pancreas: Secondary | ICD-10-CM | POA: Diagnosis not present

## 2022-02-04 DIAGNOSIS — Z79899 Other long term (current) drug therapy: Secondary | ICD-10-CM | POA: Diagnosis not present

## 2022-02-04 DIAGNOSIS — Z5111 Encounter for antineoplastic chemotherapy: Secondary | ICD-10-CM | POA: Diagnosis not present

## 2022-02-04 DIAGNOSIS — J3489 Other specified disorders of nose and nasal sinuses: Secondary | ICD-10-CM | POA: Diagnosis not present

## 2022-02-04 DIAGNOSIS — G629 Polyneuropathy, unspecified: Secondary | ICD-10-CM | POA: Diagnosis not present

## 2022-02-06 DIAGNOSIS — Z7689 Persons encountering health services in other specified circumstances: Secondary | ICD-10-CM | POA: Diagnosis not present

## 2022-02-06 DIAGNOSIS — C250 Malignant neoplasm of head of pancreas: Secondary | ICD-10-CM | POA: Diagnosis not present

## 2022-02-09 DIAGNOSIS — Z808 Family history of malignant neoplasm of other organs or systems: Secondary | ICD-10-CM | POA: Diagnosis not present

## 2022-02-09 DIAGNOSIS — L821 Other seborrheic keratosis: Secondary | ICD-10-CM | POA: Diagnosis not present

## 2022-02-09 DIAGNOSIS — L918 Other hypertrophic disorders of the skin: Secondary | ICD-10-CM | POA: Diagnosis not present

## 2022-02-17 DIAGNOSIS — C250 Malignant neoplasm of head of pancreas: Secondary | ICD-10-CM | POA: Diagnosis not present

## 2022-02-17 DIAGNOSIS — C259 Malignant neoplasm of pancreas, unspecified: Secondary | ICD-10-CM | POA: Diagnosis not present

## 2022-02-26 DIAGNOSIS — W57XXXA Bitten or stung by nonvenomous insect and other nonvenomous arthropods, initial encounter: Secondary | ICD-10-CM | POA: Diagnosis not present

## 2022-02-26 DIAGNOSIS — S30861A Insect bite (nonvenomous) of abdominal wall, initial encounter: Secondary | ICD-10-CM | POA: Diagnosis not present

## 2022-03-19 DIAGNOSIS — C250 Malignant neoplasm of head of pancreas: Secondary | ICD-10-CM | POA: Diagnosis not present

## 2022-03-19 DIAGNOSIS — Z01818 Encounter for other preprocedural examination: Secondary | ICD-10-CM | POA: Diagnosis not present

## 2022-03-19 DIAGNOSIS — J320 Chronic maxillary sinusitis: Secondary | ICD-10-CM | POA: Diagnosis not present

## 2022-03-19 DIAGNOSIS — Z888 Allergy status to other drugs, medicaments and biological substances status: Secondary | ICD-10-CM | POA: Diagnosis not present

## 2022-03-25 DIAGNOSIS — G8918 Other acute postprocedural pain: Secondary | ICD-10-CM | POA: Diagnosis not present

## 2022-03-25 DIAGNOSIS — K810 Acute cholecystitis: Secondary | ICD-10-CM | POA: Diagnosis not present

## 2022-03-25 DIAGNOSIS — I10 Essential (primary) hypertension: Secondary | ICD-10-CM | POA: Diagnosis not present

## 2022-03-25 DIAGNOSIS — R1084 Generalized abdominal pain: Secondary | ICD-10-CM | POA: Diagnosis not present

## 2022-03-25 DIAGNOSIS — R109 Unspecified abdominal pain: Secondary | ICD-10-CM | POA: Diagnosis not present

## 2022-03-25 DIAGNOSIS — R69 Illness, unspecified: Secondary | ICD-10-CM | POA: Diagnosis not present

## 2022-03-25 DIAGNOSIS — R600 Localized edema: Secondary | ICD-10-CM | POA: Diagnosis not present

## 2022-03-25 DIAGNOSIS — Z4659 Encounter for fitting and adjustment of other gastrointestinal appliance and device: Secondary | ICD-10-CM | POA: Diagnosis not present

## 2022-03-25 DIAGNOSIS — E119 Type 2 diabetes mellitus without complications: Secondary | ICD-10-CM | POA: Diagnosis not present

## 2022-03-25 DIAGNOSIS — D62 Acute posthemorrhagic anemia: Secondary | ICD-10-CM | POA: Diagnosis not present

## 2022-03-25 DIAGNOSIS — K8689 Other specified diseases of pancreas: Secondary | ICD-10-CM | POA: Diagnosis not present

## 2022-03-25 DIAGNOSIS — C259 Malignant neoplasm of pancreas, unspecified: Secondary | ICD-10-CM | POA: Diagnosis not present

## 2022-03-25 DIAGNOSIS — K8010 Calculus of gallbladder with chronic cholecystitis without obstruction: Secondary | ICD-10-CM | POA: Diagnosis not present

## 2022-03-25 DIAGNOSIS — G4733 Obstructive sleep apnea (adult) (pediatric): Secondary | ICD-10-CM | POA: Diagnosis not present

## 2022-03-25 DIAGNOSIS — C250 Malignant neoplasm of head of pancreas: Secondary | ICD-10-CM | POA: Diagnosis not present

## 2022-03-25 DIAGNOSIS — N400 Enlarged prostate without lower urinary tract symptoms: Secondary | ICD-10-CM | POA: Diagnosis not present

## 2022-03-25 DIAGNOSIS — C772 Secondary and unspecified malignant neoplasm of intra-abdominal lymph nodes: Secondary | ICD-10-CM | POA: Diagnosis not present

## 2022-04-13 DIAGNOSIS — Z90410 Acquired total absence of pancreas: Secondary | ICD-10-CM | POA: Diagnosis not present

## 2022-04-13 DIAGNOSIS — Z9221 Personal history of antineoplastic chemotherapy: Secondary | ICD-10-CM | POA: Diagnosis not present

## 2022-04-13 DIAGNOSIS — Z9049 Acquired absence of other specified parts of digestive tract: Secondary | ICD-10-CM | POA: Diagnosis not present

## 2022-04-13 DIAGNOSIS — G629 Polyneuropathy, unspecified: Secondary | ICD-10-CM | POA: Diagnosis not present

## 2022-04-13 DIAGNOSIS — J3489 Other specified disorders of nose and nasal sinuses: Secondary | ICD-10-CM | POA: Diagnosis not present

## 2022-04-13 DIAGNOSIS — C250 Malignant neoplasm of head of pancreas: Secondary | ICD-10-CM | POA: Diagnosis not present

## 2022-05-06 DIAGNOSIS — Z5111 Encounter for antineoplastic chemotherapy: Secondary | ICD-10-CM | POA: Diagnosis not present

## 2022-05-06 DIAGNOSIS — Z79899 Other long term (current) drug therapy: Secondary | ICD-10-CM | POA: Diagnosis not present

## 2022-05-06 DIAGNOSIS — C250 Malignant neoplasm of head of pancreas: Secondary | ICD-10-CM | POA: Diagnosis not present

## 2022-05-06 DIAGNOSIS — D140 Benign neoplasm of middle ear, nasal cavity and accessory sinuses: Secondary | ICD-10-CM | POA: Diagnosis not present

## 2022-05-06 DIAGNOSIS — C784 Secondary malignant neoplasm of small intestine: Secondary | ICD-10-CM | POA: Diagnosis not present

## 2022-05-06 DIAGNOSIS — C259 Malignant neoplasm of pancreas, unspecified: Secondary | ICD-10-CM | POA: Diagnosis not present

## 2022-05-08 DIAGNOSIS — C250 Malignant neoplasm of head of pancreas: Secondary | ICD-10-CM | POA: Diagnosis not present

## 2022-05-08 DIAGNOSIS — Z7689 Persons encountering health services in other specified circumstances: Secondary | ICD-10-CM | POA: Diagnosis not present

## 2022-05-08 DIAGNOSIS — Z95828 Presence of other vascular implants and grafts: Secondary | ICD-10-CM | POA: Diagnosis not present

## 2022-05-20 DIAGNOSIS — Z95828 Presence of other vascular implants and grafts: Secondary | ICD-10-CM | POA: Diagnosis not present

## 2022-05-20 DIAGNOSIS — R161 Splenomegaly, not elsewhere classified: Secondary | ICD-10-CM | POA: Diagnosis not present

## 2022-05-20 DIAGNOSIS — C250 Malignant neoplasm of head of pancreas: Secondary | ICD-10-CM | POA: Diagnosis not present

## 2022-05-20 DIAGNOSIS — R1013 Epigastric pain: Secondary | ICD-10-CM | POA: Diagnosis not present

## 2022-05-20 DIAGNOSIS — Z79899 Other long term (current) drug therapy: Secondary | ICD-10-CM | POA: Diagnosis not present

## 2022-05-20 DIAGNOSIS — T451X5D Adverse effect of antineoplastic and immunosuppressive drugs, subsequent encounter: Secondary | ICD-10-CM | POA: Diagnosis not present

## 2022-05-20 DIAGNOSIS — R7401 Elevation of levels of liver transaminase levels: Secondary | ICD-10-CM | POA: Diagnosis not present

## 2022-05-20 DIAGNOSIS — G620 Drug-induced polyneuropathy: Secondary | ICD-10-CM | POA: Diagnosis not present

## 2022-05-20 DIAGNOSIS — Z79631 Long term (current) use of antimetabolite agent: Secondary | ICD-10-CM | POA: Diagnosis not present

## 2022-05-20 DIAGNOSIS — Z79634 Long term (current) use of topoisomerase inhibitor: Secondary | ICD-10-CM | POA: Diagnosis not present

## 2022-05-20 DIAGNOSIS — Z79630 Long term (current) use of alkylating agent: Secondary | ICD-10-CM | POA: Diagnosis not present

## 2022-05-20 DIAGNOSIS — K269 Duodenal ulcer, unspecified as acute or chronic, without hemorrhage or perforation: Secondary | ICD-10-CM | POA: Diagnosis not present

## 2022-05-22 DIAGNOSIS — Z7689 Persons encountering health services in other specified circumstances: Secondary | ICD-10-CM | POA: Diagnosis not present

## 2022-05-22 DIAGNOSIS — Z451 Encounter for adjustment and management of infusion pump: Secondary | ICD-10-CM | POA: Diagnosis not present

## 2022-05-22 DIAGNOSIS — C250 Malignant neoplasm of head of pancreas: Secondary | ICD-10-CM | POA: Diagnosis not present

## 2022-06-03 DIAGNOSIS — K5900 Constipation, unspecified: Secondary | ICD-10-CM | POA: Diagnosis not present

## 2022-06-03 DIAGNOSIS — Z5111 Encounter for antineoplastic chemotherapy: Secondary | ICD-10-CM | POA: Diagnosis not present

## 2022-06-03 DIAGNOSIS — Z79630 Long term (current) use of alkylating agent: Secondary | ICD-10-CM | POA: Diagnosis not present

## 2022-06-03 DIAGNOSIS — Z79634 Long term (current) use of topoisomerase inhibitor: Secondary | ICD-10-CM | POA: Diagnosis not present

## 2022-06-03 DIAGNOSIS — Z79631 Long term (current) use of antimetabolite agent: Secondary | ICD-10-CM | POA: Diagnosis not present

## 2022-06-03 DIAGNOSIS — Z95828 Presence of other vascular implants and grafts: Secondary | ICD-10-CM | POA: Diagnosis not present

## 2022-06-03 DIAGNOSIS — Z79899 Other long term (current) drug therapy: Secondary | ICD-10-CM | POA: Diagnosis not present

## 2022-06-03 DIAGNOSIS — C250 Malignant neoplasm of head of pancreas: Secondary | ICD-10-CM | POA: Diagnosis not present

## 2022-06-03 DIAGNOSIS — C259 Malignant neoplasm of pancreas, unspecified: Secondary | ICD-10-CM | POA: Diagnosis not present

## 2022-06-03 DIAGNOSIS — R143 Flatulence: Secondary | ICD-10-CM | POA: Diagnosis not present

## 2022-06-03 DIAGNOSIS — J3489 Other specified disorders of nose and nasal sinuses: Secondary | ICD-10-CM | POA: Diagnosis not present

## 2022-06-03 DIAGNOSIS — K269 Duodenal ulcer, unspecified as acute or chronic, without hemorrhage or perforation: Secondary | ICD-10-CM | POA: Diagnosis not present

## 2022-06-04 DIAGNOSIS — N401 Enlarged prostate with lower urinary tract symptoms: Secondary | ICD-10-CM | POA: Diagnosis not present

## 2022-06-04 DIAGNOSIS — R350 Frequency of micturition: Secondary | ICD-10-CM | POA: Diagnosis not present

## 2022-06-04 DIAGNOSIS — R351 Nocturia: Secondary | ICD-10-CM | POA: Diagnosis not present

## 2022-06-05 DIAGNOSIS — Z7689 Persons encountering health services in other specified circumstances: Secondary | ICD-10-CM | POA: Diagnosis not present

## 2022-06-05 DIAGNOSIS — C250 Malignant neoplasm of head of pancreas: Secondary | ICD-10-CM | POA: Diagnosis not present

## 2022-06-17 DIAGNOSIS — Z79899 Other long term (current) drug therapy: Secondary | ICD-10-CM | POA: Diagnosis not present

## 2022-06-17 DIAGNOSIS — Z5111 Encounter for antineoplastic chemotherapy: Secondary | ICD-10-CM | POA: Diagnosis not present

## 2022-06-17 DIAGNOSIS — Z9889 Other specified postprocedural states: Secondary | ICD-10-CM | POA: Diagnosis not present

## 2022-06-17 DIAGNOSIS — J3489 Other specified disorders of nose and nasal sinuses: Secondary | ICD-10-CM | POA: Diagnosis not present

## 2022-06-17 DIAGNOSIS — R7401 Elevation of levels of liver transaminase levels: Secondary | ICD-10-CM | POA: Diagnosis not present

## 2022-06-17 DIAGNOSIS — T451X5A Adverse effect of antineoplastic and immunosuppressive drugs, initial encounter: Secondary | ICD-10-CM | POA: Diagnosis not present

## 2022-06-17 DIAGNOSIS — G620 Drug-induced polyneuropathy: Secondary | ICD-10-CM | POA: Diagnosis not present

## 2022-06-17 DIAGNOSIS — C250 Malignant neoplasm of head of pancreas: Secondary | ICD-10-CM | POA: Diagnosis not present

## 2022-06-19 DIAGNOSIS — C20 Malignant neoplasm of rectum: Secondary | ICD-10-CM | POA: Diagnosis not present

## 2022-06-19 DIAGNOSIS — Z7689 Persons encountering health services in other specified circumstances: Secondary | ICD-10-CM | POA: Diagnosis not present

## 2022-06-19 DIAGNOSIS — L00 Staphylococcal scalded skin syndrome: Secondary | ICD-10-CM | POA: Diagnosis not present

## 2022-06-19 DIAGNOSIS — B20 Human immunodeficiency virus [HIV] disease: Secondary | ICD-10-CM | POA: Diagnosis not present

## 2022-06-19 DIAGNOSIS — C250 Malignant neoplasm of head of pancreas: Secondary | ICD-10-CM | POA: Diagnosis not present

## 2022-07-09 DIAGNOSIS — Z9049 Acquired absence of other specified parts of digestive tract: Secondary | ICD-10-CM | POA: Diagnosis not present

## 2022-07-09 DIAGNOSIS — Z90411 Acquired partial absence of pancreas: Secondary | ICD-10-CM | POA: Diagnosis not present

## 2022-07-09 DIAGNOSIS — C250 Malignant neoplasm of head of pancreas: Secondary | ICD-10-CM | POA: Diagnosis not present

## 2022-07-09 DIAGNOSIS — C259 Malignant neoplasm of pancreas, unspecified: Secondary | ICD-10-CM | POA: Diagnosis not present

## 2022-07-16 DIAGNOSIS — N281 Cyst of kidney, acquired: Secondary | ICD-10-CM | POA: Diagnosis not present

## 2022-07-16 DIAGNOSIS — J9811 Atelectasis: Secondary | ICD-10-CM | POA: Diagnosis not present

## 2022-07-16 DIAGNOSIS — K760 Fatty (change of) liver, not elsewhere classified: Secondary | ICD-10-CM | POA: Diagnosis not present

## 2022-07-16 DIAGNOSIS — Z9889 Other specified postprocedural states: Secondary | ICD-10-CM | POA: Diagnosis not present

## 2022-07-16 DIAGNOSIS — R188 Other ascites: Secondary | ICD-10-CM | POA: Diagnosis not present

## 2022-07-16 DIAGNOSIS — C259 Malignant neoplasm of pancreas, unspecified: Secondary | ICD-10-CM | POA: Diagnosis not present

## 2022-07-16 DIAGNOSIS — R911 Solitary pulmonary nodule: Secondary | ICD-10-CM | POA: Diagnosis not present

## 2022-07-16 DIAGNOSIS — Z8507 Personal history of malignant neoplasm of pancreas: Secondary | ICD-10-CM | POA: Diagnosis not present

## 2022-07-17 DIAGNOSIS — Z9049 Acquired absence of other specified parts of digestive tract: Secondary | ICD-10-CM | POA: Diagnosis not present

## 2022-07-17 DIAGNOSIS — C250 Malignant neoplasm of head of pancreas: Secondary | ICD-10-CM | POA: Diagnosis not present

## 2022-07-17 DIAGNOSIS — Z90411 Acquired partial absence of pancreas: Secondary | ICD-10-CM | POA: Diagnosis not present

## 2022-07-30 DIAGNOSIS — Z90411 Acquired partial absence of pancreas: Secondary | ICD-10-CM | POA: Diagnosis not present

## 2022-07-30 DIAGNOSIS — C250 Malignant neoplasm of head of pancreas: Secondary | ICD-10-CM | POA: Diagnosis not present

## 2022-07-30 DIAGNOSIS — Z9049 Acquired absence of other specified parts of digestive tract: Secondary | ICD-10-CM | POA: Diagnosis not present

## 2022-08-03 DIAGNOSIS — Z510 Encounter for antineoplastic radiation therapy: Secondary | ICD-10-CM | POA: Diagnosis not present

## 2022-08-03 DIAGNOSIS — Z09 Encounter for follow-up examination after completed treatment for conditions other than malignant neoplasm: Secondary | ICD-10-CM | POA: Diagnosis not present

## 2022-08-03 DIAGNOSIS — C250 Malignant neoplasm of head of pancreas: Secondary | ICD-10-CM | POA: Diagnosis not present

## 2022-08-03 DIAGNOSIS — Z79631 Long term (current) use of antimetabolite agent: Secondary | ICD-10-CM | POA: Diagnosis not present

## 2022-08-03 DIAGNOSIS — R197 Diarrhea, unspecified: Secondary | ICD-10-CM | POA: Diagnosis not present

## 2022-08-03 DIAGNOSIS — C259 Malignant neoplasm of pancreas, unspecified: Secondary | ICD-10-CM | POA: Diagnosis not present

## 2022-08-04 DIAGNOSIS — Z79631 Long term (current) use of antimetabolite agent: Secondary | ICD-10-CM | POA: Diagnosis not present

## 2022-08-04 DIAGNOSIS — C259 Malignant neoplasm of pancreas, unspecified: Secondary | ICD-10-CM | POA: Diagnosis not present

## 2022-08-04 DIAGNOSIS — Z09 Encounter for follow-up examination after completed treatment for conditions other than malignant neoplasm: Secondary | ICD-10-CM | POA: Diagnosis not present

## 2022-08-04 DIAGNOSIS — Z510 Encounter for antineoplastic radiation therapy: Secondary | ICD-10-CM | POA: Diagnosis not present

## 2022-08-04 DIAGNOSIS — C250 Malignant neoplasm of head of pancreas: Secondary | ICD-10-CM | POA: Diagnosis not present

## 2022-08-04 DIAGNOSIS — R197 Diarrhea, unspecified: Secondary | ICD-10-CM | POA: Diagnosis not present

## 2022-08-05 DIAGNOSIS — Z510 Encounter for antineoplastic radiation therapy: Secondary | ICD-10-CM | POA: Diagnosis not present

## 2022-08-05 DIAGNOSIS — Z09 Encounter for follow-up examination after completed treatment for conditions other than malignant neoplasm: Secondary | ICD-10-CM | POA: Diagnosis not present

## 2022-08-05 DIAGNOSIS — C250 Malignant neoplasm of head of pancreas: Secondary | ICD-10-CM | POA: Diagnosis not present

## 2022-08-05 DIAGNOSIS — Z79631 Long term (current) use of antimetabolite agent: Secondary | ICD-10-CM | POA: Diagnosis not present

## 2022-08-05 DIAGNOSIS — R197 Diarrhea, unspecified: Secondary | ICD-10-CM | POA: Diagnosis not present

## 2022-08-05 DIAGNOSIS — C259 Malignant neoplasm of pancreas, unspecified: Secondary | ICD-10-CM | POA: Diagnosis not present

## 2022-08-06 DIAGNOSIS — Z510 Encounter for antineoplastic radiation therapy: Secondary | ICD-10-CM | POA: Diagnosis not present

## 2022-08-06 DIAGNOSIS — Z79631 Long term (current) use of antimetabolite agent: Secondary | ICD-10-CM | POA: Diagnosis not present

## 2022-08-06 DIAGNOSIS — C250 Malignant neoplasm of head of pancreas: Secondary | ICD-10-CM | POA: Diagnosis not present

## 2022-08-06 DIAGNOSIS — Z09 Encounter for follow-up examination after completed treatment for conditions other than malignant neoplasm: Secondary | ICD-10-CM | POA: Diagnosis not present

## 2022-08-06 DIAGNOSIS — R197 Diarrhea, unspecified: Secondary | ICD-10-CM | POA: Diagnosis not present

## 2022-08-06 DIAGNOSIS — C259 Malignant neoplasm of pancreas, unspecified: Secondary | ICD-10-CM | POA: Diagnosis not present

## 2022-08-07 DIAGNOSIS — Z79631 Long term (current) use of antimetabolite agent: Secondary | ICD-10-CM | POA: Diagnosis not present

## 2022-08-07 DIAGNOSIS — Z510 Encounter for antineoplastic radiation therapy: Secondary | ICD-10-CM | POA: Diagnosis not present

## 2022-08-07 DIAGNOSIS — R197 Diarrhea, unspecified: Secondary | ICD-10-CM | POA: Diagnosis not present

## 2022-08-07 DIAGNOSIS — Z09 Encounter for follow-up examination after completed treatment for conditions other than malignant neoplasm: Secondary | ICD-10-CM | POA: Diagnosis not present

## 2022-08-07 DIAGNOSIS — C259 Malignant neoplasm of pancreas, unspecified: Secondary | ICD-10-CM | POA: Diagnosis not present

## 2022-08-07 DIAGNOSIS — C250 Malignant neoplasm of head of pancreas: Secondary | ICD-10-CM | POA: Diagnosis not present

## 2022-08-10 DIAGNOSIS — C250 Malignant neoplasm of head of pancreas: Secondary | ICD-10-CM | POA: Diagnosis not present

## 2022-08-10 DIAGNOSIS — R197 Diarrhea, unspecified: Secondary | ICD-10-CM | POA: Diagnosis not present

## 2022-08-10 DIAGNOSIS — Z510 Encounter for antineoplastic radiation therapy: Secondary | ICD-10-CM | POA: Diagnosis not present

## 2022-08-10 DIAGNOSIS — Z09 Encounter for follow-up examination after completed treatment for conditions other than malignant neoplasm: Secondary | ICD-10-CM | POA: Diagnosis not present

## 2022-08-10 DIAGNOSIS — C259 Malignant neoplasm of pancreas, unspecified: Secondary | ICD-10-CM | POA: Diagnosis not present

## 2022-08-10 DIAGNOSIS — Z79631 Long term (current) use of antimetabolite agent: Secondary | ICD-10-CM | POA: Diagnosis not present

## 2022-08-11 DIAGNOSIS — Z510 Encounter for antineoplastic radiation therapy: Secondary | ICD-10-CM | POA: Diagnosis not present

## 2022-08-11 DIAGNOSIS — C259 Malignant neoplasm of pancreas, unspecified: Secondary | ICD-10-CM | POA: Diagnosis not present

## 2022-08-11 DIAGNOSIS — Z79631 Long term (current) use of antimetabolite agent: Secondary | ICD-10-CM | POA: Diagnosis not present

## 2022-08-11 DIAGNOSIS — Z09 Encounter for follow-up examination after completed treatment for conditions other than malignant neoplasm: Secondary | ICD-10-CM | POA: Diagnosis not present

## 2022-08-11 DIAGNOSIS — R197 Diarrhea, unspecified: Secondary | ICD-10-CM | POA: Diagnosis not present

## 2022-08-11 DIAGNOSIS — C250 Malignant neoplasm of head of pancreas: Secondary | ICD-10-CM | POA: Diagnosis not present

## 2022-08-12 DIAGNOSIS — Z09 Encounter for follow-up examination after completed treatment for conditions other than malignant neoplasm: Secondary | ICD-10-CM | POA: Diagnosis not present

## 2022-08-12 DIAGNOSIS — Z79631 Long term (current) use of antimetabolite agent: Secondary | ICD-10-CM | POA: Diagnosis not present

## 2022-08-12 DIAGNOSIS — C259 Malignant neoplasm of pancreas, unspecified: Secondary | ICD-10-CM | POA: Diagnosis not present

## 2022-08-12 DIAGNOSIS — R197 Diarrhea, unspecified: Secondary | ICD-10-CM | POA: Diagnosis not present

## 2022-08-12 DIAGNOSIS — Z510 Encounter for antineoplastic radiation therapy: Secondary | ICD-10-CM | POA: Diagnosis not present

## 2022-08-12 DIAGNOSIS — C250 Malignant neoplasm of head of pancreas: Secondary | ICD-10-CM | POA: Diagnosis not present

## 2022-08-13 DIAGNOSIS — Z79631 Long term (current) use of antimetabolite agent: Secondary | ICD-10-CM | POA: Diagnosis not present

## 2022-08-13 DIAGNOSIS — C250 Malignant neoplasm of head of pancreas: Secondary | ICD-10-CM | POA: Diagnosis not present

## 2022-08-13 DIAGNOSIS — Z510 Encounter for antineoplastic radiation therapy: Secondary | ICD-10-CM | POA: Diagnosis not present

## 2022-08-13 DIAGNOSIS — R197 Diarrhea, unspecified: Secondary | ICD-10-CM | POA: Diagnosis not present

## 2022-08-13 DIAGNOSIS — C259 Malignant neoplasm of pancreas, unspecified: Secondary | ICD-10-CM | POA: Diagnosis not present

## 2022-08-13 DIAGNOSIS — Z09 Encounter for follow-up examination after completed treatment for conditions other than malignant neoplasm: Secondary | ICD-10-CM | POA: Diagnosis not present

## 2022-08-14 DIAGNOSIS — Z79631 Long term (current) use of antimetabolite agent: Secondary | ICD-10-CM | POA: Diagnosis not present

## 2022-08-14 DIAGNOSIS — Z09 Encounter for follow-up examination after completed treatment for conditions other than malignant neoplasm: Secondary | ICD-10-CM | POA: Diagnosis not present

## 2022-08-14 DIAGNOSIS — Z510 Encounter for antineoplastic radiation therapy: Secondary | ICD-10-CM | POA: Diagnosis not present

## 2022-08-14 DIAGNOSIS — C250 Malignant neoplasm of head of pancreas: Secondary | ICD-10-CM | POA: Diagnosis not present

## 2022-08-14 DIAGNOSIS — R197 Diarrhea, unspecified: Secondary | ICD-10-CM | POA: Diagnosis not present

## 2022-08-14 DIAGNOSIS — C259 Malignant neoplasm of pancreas, unspecified: Secondary | ICD-10-CM | POA: Diagnosis not present

## 2022-08-17 DIAGNOSIS — Z510 Encounter for antineoplastic radiation therapy: Secondary | ICD-10-CM | POA: Diagnosis not present

## 2022-08-17 DIAGNOSIS — Z79631 Long term (current) use of antimetabolite agent: Secondary | ICD-10-CM | POA: Diagnosis not present

## 2022-08-17 DIAGNOSIS — C259 Malignant neoplasm of pancreas, unspecified: Secondary | ICD-10-CM | POA: Diagnosis not present

## 2022-08-17 DIAGNOSIS — R197 Diarrhea, unspecified: Secondary | ICD-10-CM | POA: Diagnosis not present

## 2022-08-17 DIAGNOSIS — Z09 Encounter for follow-up examination after completed treatment for conditions other than malignant neoplasm: Secondary | ICD-10-CM | POA: Diagnosis not present

## 2022-08-17 DIAGNOSIS — C250 Malignant neoplasm of head of pancreas: Secondary | ICD-10-CM | POA: Diagnosis not present

## 2022-08-18 DIAGNOSIS — Z79631 Long term (current) use of antimetabolite agent: Secondary | ICD-10-CM | POA: Diagnosis not present

## 2022-08-18 DIAGNOSIS — Z510 Encounter for antineoplastic radiation therapy: Secondary | ICD-10-CM | POA: Diagnosis not present

## 2022-08-18 DIAGNOSIS — Z09 Encounter for follow-up examination after completed treatment for conditions other than malignant neoplasm: Secondary | ICD-10-CM | POA: Diagnosis not present

## 2022-08-18 DIAGNOSIS — C259 Malignant neoplasm of pancreas, unspecified: Secondary | ICD-10-CM | POA: Diagnosis not present

## 2022-08-18 DIAGNOSIS — C250 Malignant neoplasm of head of pancreas: Secondary | ICD-10-CM | POA: Diagnosis not present

## 2022-08-18 DIAGNOSIS — R197 Diarrhea, unspecified: Secondary | ICD-10-CM | POA: Diagnosis not present

## 2022-08-19 DIAGNOSIS — Z09 Encounter for follow-up examination after completed treatment for conditions other than malignant neoplasm: Secondary | ICD-10-CM | POA: Diagnosis not present

## 2022-08-19 DIAGNOSIS — R197 Diarrhea, unspecified: Secondary | ICD-10-CM | POA: Diagnosis not present

## 2022-08-19 DIAGNOSIS — C259 Malignant neoplasm of pancreas, unspecified: Secondary | ICD-10-CM | POA: Diagnosis not present

## 2022-08-19 DIAGNOSIS — Z79631 Long term (current) use of antimetabolite agent: Secondary | ICD-10-CM | POA: Diagnosis not present

## 2022-08-19 DIAGNOSIS — Z510 Encounter for antineoplastic radiation therapy: Secondary | ICD-10-CM | POA: Diagnosis not present

## 2022-08-19 DIAGNOSIS — C250 Malignant neoplasm of head of pancreas: Secondary | ICD-10-CM | POA: Diagnosis not present

## 2022-08-20 DIAGNOSIS — Z09 Encounter for follow-up examination after completed treatment for conditions other than malignant neoplasm: Secondary | ICD-10-CM | POA: Diagnosis not present

## 2022-08-20 DIAGNOSIS — C259 Malignant neoplasm of pancreas, unspecified: Secondary | ICD-10-CM | POA: Diagnosis not present

## 2022-08-20 DIAGNOSIS — Z510 Encounter for antineoplastic radiation therapy: Secondary | ICD-10-CM | POA: Diagnosis not present

## 2022-08-20 DIAGNOSIS — Z79631 Long term (current) use of antimetabolite agent: Secondary | ICD-10-CM | POA: Diagnosis not present

## 2022-08-20 DIAGNOSIS — R197 Diarrhea, unspecified: Secondary | ICD-10-CM | POA: Diagnosis not present

## 2022-08-20 DIAGNOSIS — C250 Malignant neoplasm of head of pancreas: Secondary | ICD-10-CM | POA: Diagnosis not present

## 2022-08-21 DIAGNOSIS — C250 Malignant neoplasm of head of pancreas: Secondary | ICD-10-CM | POA: Diagnosis not present

## 2022-08-21 DIAGNOSIS — R197 Diarrhea, unspecified: Secondary | ICD-10-CM | POA: Diagnosis not present

## 2022-08-21 DIAGNOSIS — Z09 Encounter for follow-up examination after completed treatment for conditions other than malignant neoplasm: Secondary | ICD-10-CM | POA: Diagnosis not present

## 2022-08-21 DIAGNOSIS — C259 Malignant neoplasm of pancreas, unspecified: Secondary | ICD-10-CM | POA: Diagnosis not present

## 2022-08-21 DIAGNOSIS — Z510 Encounter for antineoplastic radiation therapy: Secondary | ICD-10-CM | POA: Diagnosis not present

## 2022-08-21 DIAGNOSIS — Z79631 Long term (current) use of antimetabolite agent: Secondary | ICD-10-CM | POA: Diagnosis not present

## 2022-08-24 DIAGNOSIS — C250 Malignant neoplasm of head of pancreas: Secondary | ICD-10-CM | POA: Diagnosis not present

## 2022-08-24 DIAGNOSIS — C259 Malignant neoplasm of pancreas, unspecified: Secondary | ICD-10-CM | POA: Diagnosis not present

## 2022-08-24 DIAGNOSIS — Z79631 Long term (current) use of antimetabolite agent: Secondary | ICD-10-CM | POA: Diagnosis not present

## 2022-08-24 DIAGNOSIS — R197 Diarrhea, unspecified: Secondary | ICD-10-CM | POA: Diagnosis not present

## 2022-08-24 DIAGNOSIS — Z09 Encounter for follow-up examination after completed treatment for conditions other than malignant neoplasm: Secondary | ICD-10-CM | POA: Diagnosis not present

## 2022-08-24 DIAGNOSIS — Z510 Encounter for antineoplastic radiation therapy: Secondary | ICD-10-CM | POA: Diagnosis not present

## 2022-08-25 DIAGNOSIS — Z09 Encounter for follow-up examination after completed treatment for conditions other than malignant neoplasm: Secondary | ICD-10-CM | POA: Diagnosis not present

## 2022-08-25 DIAGNOSIS — Z79631 Long term (current) use of antimetabolite agent: Secondary | ICD-10-CM | POA: Diagnosis not present

## 2022-08-25 DIAGNOSIS — C250 Malignant neoplasm of head of pancreas: Secondary | ICD-10-CM | POA: Diagnosis not present

## 2022-08-25 DIAGNOSIS — Z510 Encounter for antineoplastic radiation therapy: Secondary | ICD-10-CM | POA: Diagnosis not present

## 2022-08-25 DIAGNOSIS — R197 Diarrhea, unspecified: Secondary | ICD-10-CM | POA: Diagnosis not present

## 2022-08-25 DIAGNOSIS — C259 Malignant neoplasm of pancreas, unspecified: Secondary | ICD-10-CM | POA: Diagnosis not present

## 2022-08-26 DIAGNOSIS — Z09 Encounter for follow-up examination after completed treatment for conditions other than malignant neoplasm: Secondary | ICD-10-CM | POA: Diagnosis not present

## 2022-08-26 DIAGNOSIS — C259 Malignant neoplasm of pancreas, unspecified: Secondary | ICD-10-CM | POA: Diagnosis not present

## 2022-08-26 DIAGNOSIS — C250 Malignant neoplasm of head of pancreas: Secondary | ICD-10-CM | POA: Diagnosis not present

## 2022-08-26 DIAGNOSIS — R197 Diarrhea, unspecified: Secondary | ICD-10-CM | POA: Diagnosis not present

## 2022-08-26 DIAGNOSIS — Z79631 Long term (current) use of antimetabolite agent: Secondary | ICD-10-CM | POA: Diagnosis not present

## 2022-08-26 DIAGNOSIS — Z510 Encounter for antineoplastic radiation therapy: Secondary | ICD-10-CM | POA: Diagnosis not present

## 2022-08-27 DIAGNOSIS — C250 Malignant neoplasm of head of pancreas: Secondary | ICD-10-CM | POA: Diagnosis not present

## 2022-08-27 DIAGNOSIS — Z79631 Long term (current) use of antimetabolite agent: Secondary | ICD-10-CM | POA: Diagnosis not present

## 2022-08-27 DIAGNOSIS — Z510 Encounter for antineoplastic radiation therapy: Secondary | ICD-10-CM | POA: Diagnosis not present

## 2022-08-27 DIAGNOSIS — R197 Diarrhea, unspecified: Secondary | ICD-10-CM | POA: Diagnosis not present

## 2022-08-27 DIAGNOSIS — Z09 Encounter for follow-up examination after completed treatment for conditions other than malignant neoplasm: Secondary | ICD-10-CM | POA: Diagnosis not present

## 2022-08-27 DIAGNOSIS — C259 Malignant neoplasm of pancreas, unspecified: Secondary | ICD-10-CM | POA: Diagnosis not present

## 2022-08-28 DIAGNOSIS — C250 Malignant neoplasm of head of pancreas: Secondary | ICD-10-CM | POA: Diagnosis not present

## 2022-08-28 DIAGNOSIS — Z79631 Long term (current) use of antimetabolite agent: Secondary | ICD-10-CM | POA: Diagnosis not present

## 2022-08-28 DIAGNOSIS — Z09 Encounter for follow-up examination after completed treatment for conditions other than malignant neoplasm: Secondary | ICD-10-CM | POA: Diagnosis not present

## 2022-08-28 DIAGNOSIS — C259 Malignant neoplasm of pancreas, unspecified: Secondary | ICD-10-CM | POA: Diagnosis not present

## 2022-08-28 DIAGNOSIS — R197 Diarrhea, unspecified: Secondary | ICD-10-CM | POA: Diagnosis not present

## 2022-08-28 DIAGNOSIS — Z510 Encounter for antineoplastic radiation therapy: Secondary | ICD-10-CM | POA: Diagnosis not present

## 2022-08-31 DIAGNOSIS — C259 Malignant neoplasm of pancreas, unspecified: Secondary | ICD-10-CM | POA: Diagnosis not present

## 2022-08-31 DIAGNOSIS — R197 Diarrhea, unspecified: Secondary | ICD-10-CM | POA: Diagnosis not present

## 2022-08-31 DIAGNOSIS — C250 Malignant neoplasm of head of pancreas: Secondary | ICD-10-CM | POA: Diagnosis not present

## 2022-08-31 DIAGNOSIS — Z09 Encounter for follow-up examination after completed treatment for conditions other than malignant neoplasm: Secondary | ICD-10-CM | POA: Diagnosis not present

## 2022-08-31 DIAGNOSIS — Z510 Encounter for antineoplastic radiation therapy: Secondary | ICD-10-CM | POA: Diagnosis not present

## 2022-08-31 DIAGNOSIS — Z79631 Long term (current) use of antimetabolite agent: Secondary | ICD-10-CM | POA: Diagnosis not present

## 2022-09-01 DIAGNOSIS — Z09 Encounter for follow-up examination after completed treatment for conditions other than malignant neoplasm: Secondary | ICD-10-CM | POA: Diagnosis not present

## 2022-09-01 DIAGNOSIS — Z79631 Long term (current) use of antimetabolite agent: Secondary | ICD-10-CM | POA: Diagnosis not present

## 2022-09-01 DIAGNOSIS — C250 Malignant neoplasm of head of pancreas: Secondary | ICD-10-CM | POA: Diagnosis not present

## 2022-09-01 DIAGNOSIS — Z510 Encounter for antineoplastic radiation therapy: Secondary | ICD-10-CM | POA: Diagnosis not present

## 2022-09-01 DIAGNOSIS — R197 Diarrhea, unspecified: Secondary | ICD-10-CM | POA: Diagnosis not present

## 2022-09-01 DIAGNOSIS — C259 Malignant neoplasm of pancreas, unspecified: Secondary | ICD-10-CM | POA: Diagnosis not present

## 2022-09-02 DIAGNOSIS — J341 Cyst and mucocele of nose and nasal sinus: Secondary | ICD-10-CM | POA: Diagnosis not present

## 2022-09-02 DIAGNOSIS — Z09 Encounter for follow-up examination after completed treatment for conditions other than malignant neoplasm: Secondary | ICD-10-CM | POA: Diagnosis not present

## 2022-09-02 DIAGNOSIS — Z510 Encounter for antineoplastic radiation therapy: Secondary | ICD-10-CM | POA: Diagnosis not present

## 2022-09-02 DIAGNOSIS — C250 Malignant neoplasm of head of pancreas: Secondary | ICD-10-CM | POA: Diagnosis not present

## 2022-09-02 DIAGNOSIS — Z5111 Encounter for antineoplastic chemotherapy: Secondary | ICD-10-CM | POA: Diagnosis not present

## 2022-09-02 DIAGNOSIS — C259 Malignant neoplasm of pancreas, unspecified: Secondary | ICD-10-CM | POA: Diagnosis not present

## 2022-09-03 DIAGNOSIS — C250 Malignant neoplasm of head of pancreas: Secondary | ICD-10-CM | POA: Diagnosis not present

## 2022-09-03 DIAGNOSIS — C259 Malignant neoplasm of pancreas, unspecified: Secondary | ICD-10-CM | POA: Diagnosis not present

## 2022-09-03 DIAGNOSIS — J341 Cyst and mucocele of nose and nasal sinus: Secondary | ICD-10-CM | POA: Diagnosis not present

## 2022-09-03 DIAGNOSIS — Z09 Encounter for follow-up examination after completed treatment for conditions other than malignant neoplasm: Secondary | ICD-10-CM | POA: Diagnosis not present

## 2022-09-03 DIAGNOSIS — Z5111 Encounter for antineoplastic chemotherapy: Secondary | ICD-10-CM | POA: Diagnosis not present

## 2022-09-03 DIAGNOSIS — Z510 Encounter for antineoplastic radiation therapy: Secondary | ICD-10-CM | POA: Diagnosis not present

## 2022-09-04 DIAGNOSIS — C250 Malignant neoplasm of head of pancreas: Secondary | ICD-10-CM | POA: Diagnosis not present

## 2022-09-04 DIAGNOSIS — Z510 Encounter for antineoplastic radiation therapy: Secondary | ICD-10-CM | POA: Diagnosis not present

## 2022-09-04 DIAGNOSIS — Z09 Encounter for follow-up examination after completed treatment for conditions other than malignant neoplasm: Secondary | ICD-10-CM | POA: Diagnosis not present

## 2022-09-04 DIAGNOSIS — C259 Malignant neoplasm of pancreas, unspecified: Secondary | ICD-10-CM | POA: Diagnosis not present

## 2022-09-04 DIAGNOSIS — J341 Cyst and mucocele of nose and nasal sinus: Secondary | ICD-10-CM | POA: Diagnosis not present

## 2022-09-04 DIAGNOSIS — Z5111 Encounter for antineoplastic chemotherapy: Secondary | ICD-10-CM | POA: Diagnosis not present

## 2022-09-07 DIAGNOSIS — Z09 Encounter for follow-up examination after completed treatment for conditions other than malignant neoplasm: Secondary | ICD-10-CM | POA: Diagnosis not present

## 2022-09-07 DIAGNOSIS — J341 Cyst and mucocele of nose and nasal sinus: Secondary | ICD-10-CM | POA: Diagnosis not present

## 2022-09-07 DIAGNOSIS — C259 Malignant neoplasm of pancreas, unspecified: Secondary | ICD-10-CM | POA: Diagnosis not present

## 2022-09-07 DIAGNOSIS — C250 Malignant neoplasm of head of pancreas: Secondary | ICD-10-CM | POA: Diagnosis not present

## 2022-09-07 DIAGNOSIS — Z5111 Encounter for antineoplastic chemotherapy: Secondary | ICD-10-CM | POA: Diagnosis not present

## 2022-09-07 DIAGNOSIS — Z510 Encounter for antineoplastic radiation therapy: Secondary | ICD-10-CM | POA: Diagnosis not present

## 2022-09-08 DIAGNOSIS — J341 Cyst and mucocele of nose and nasal sinus: Secondary | ICD-10-CM | POA: Diagnosis not present

## 2022-09-08 DIAGNOSIS — Z510 Encounter for antineoplastic radiation therapy: Secondary | ICD-10-CM | POA: Diagnosis not present

## 2022-09-08 DIAGNOSIS — Z5111 Encounter for antineoplastic chemotherapy: Secondary | ICD-10-CM | POA: Diagnosis not present

## 2022-09-08 DIAGNOSIS — C250 Malignant neoplasm of head of pancreas: Secondary | ICD-10-CM | POA: Diagnosis not present

## 2022-09-08 DIAGNOSIS — Z09 Encounter for follow-up examination after completed treatment for conditions other than malignant neoplasm: Secondary | ICD-10-CM | POA: Diagnosis not present

## 2022-09-08 DIAGNOSIS — C259 Malignant neoplasm of pancreas, unspecified: Secondary | ICD-10-CM | POA: Diagnosis not present

## 2022-09-09 DIAGNOSIS — C250 Malignant neoplasm of head of pancreas: Secondary | ICD-10-CM | POA: Diagnosis not present

## 2022-09-09 DIAGNOSIS — C259 Malignant neoplasm of pancreas, unspecified: Secondary | ICD-10-CM | POA: Diagnosis not present

## 2022-09-09 DIAGNOSIS — Z510 Encounter for antineoplastic radiation therapy: Secondary | ICD-10-CM | POA: Diagnosis not present

## 2022-09-09 DIAGNOSIS — Z5111 Encounter for antineoplastic chemotherapy: Secondary | ICD-10-CM | POA: Diagnosis not present

## 2022-09-09 DIAGNOSIS — J341 Cyst and mucocele of nose and nasal sinus: Secondary | ICD-10-CM | POA: Diagnosis not present

## 2022-09-09 DIAGNOSIS — Z09 Encounter for follow-up examination after completed treatment for conditions other than malignant neoplasm: Secondary | ICD-10-CM | POA: Diagnosis not present

## 2022-09-10 DIAGNOSIS — C250 Malignant neoplasm of head of pancreas: Secondary | ICD-10-CM | POA: Diagnosis not present

## 2022-09-10 DIAGNOSIS — Z5111 Encounter for antineoplastic chemotherapy: Secondary | ICD-10-CM | POA: Diagnosis not present

## 2022-09-10 DIAGNOSIS — J341 Cyst and mucocele of nose and nasal sinus: Secondary | ICD-10-CM | POA: Diagnosis not present

## 2022-09-10 DIAGNOSIS — C259 Malignant neoplasm of pancreas, unspecified: Secondary | ICD-10-CM | POA: Diagnosis not present

## 2022-09-10 DIAGNOSIS — Z09 Encounter for follow-up examination after completed treatment for conditions other than malignant neoplasm: Secondary | ICD-10-CM | POA: Diagnosis not present

## 2022-09-10 DIAGNOSIS — Z510 Encounter for antineoplastic radiation therapy: Secondary | ICD-10-CM | POA: Diagnosis not present

## 2022-09-18 DIAGNOSIS — G620 Drug-induced polyneuropathy: Secondary | ICD-10-CM | POA: Diagnosis not present

## 2022-09-18 DIAGNOSIS — Z833 Family history of diabetes mellitus: Secondary | ICD-10-CM | POA: Diagnosis not present

## 2022-09-18 DIAGNOSIS — E785 Hyperlipidemia, unspecified: Secondary | ICD-10-CM | POA: Diagnosis not present

## 2022-09-18 DIAGNOSIS — Z809 Family history of malignant neoplasm, unspecified: Secondary | ICD-10-CM | POA: Diagnosis not present

## 2022-09-18 DIAGNOSIS — Z8546 Personal history of malignant neoplasm of prostate: Secondary | ICD-10-CM | POA: Diagnosis not present

## 2022-09-18 DIAGNOSIS — I10 Essential (primary) hypertension: Secondary | ICD-10-CM | POA: Diagnosis not present

## 2022-09-18 DIAGNOSIS — K219 Gastro-esophageal reflux disease without esophagitis: Secondary | ICD-10-CM | POA: Diagnosis not present

## 2022-09-18 DIAGNOSIS — K5900 Constipation, unspecified: Secondary | ICD-10-CM | POA: Diagnosis not present

## 2022-09-18 DIAGNOSIS — I2510 Atherosclerotic heart disease of native coronary artery without angina pectoris: Secondary | ICD-10-CM | POA: Diagnosis not present

## 2022-09-18 DIAGNOSIS — Z8249 Family history of ischemic heart disease and other diseases of the circulatory system: Secondary | ICD-10-CM | POA: Diagnosis not present

## 2022-09-18 DIAGNOSIS — N400 Enlarged prostate without lower urinary tract symptoms: Secondary | ICD-10-CM | POA: Diagnosis not present

## 2022-09-18 DIAGNOSIS — G4733 Obstructive sleep apnea (adult) (pediatric): Secondary | ICD-10-CM | POA: Diagnosis not present

## 2022-10-23 DIAGNOSIS — C250 Malignant neoplasm of head of pancreas: Secondary | ICD-10-CM | POA: Diagnosis not present

## 2023-11-19 ENCOUNTER — Encounter: Payer: Self-pay | Admitting: Advanced Practice Midwife
